# Patient Record
Sex: Male | Born: 1995 | Race: Black or African American | Hispanic: No | Marital: Single | Smoking: Never smoker
Health system: Southern US, Community
[De-identification: ages and names within clinical notes are randomized; demographics above are authoritative.]

## PROBLEM LIST (undated history)

## (undated) DIAGNOSIS — R55 Syncope and collapse: Secondary | ICD-10-CM

## (undated) DIAGNOSIS — I10 Essential (primary) hypertension: Secondary | ICD-10-CM

## (undated) HISTORY — DX: Essential (primary) hypertension: I10

## (undated) HISTORY — DX: Syncope and collapse: R55

## (undated) HISTORY — PX: NO PAST SURGERIES: SHX2092

---
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
## 2016-11-11 ENCOUNTER — Emergency Department (HOSPITAL_COMMUNITY)
Admission: EM | Admit: 2016-11-11 | Discharge: 2016-11-12 | Disposition: A | Discharge status: Home / Self Care | Payer: Self-pay | Attending: Emergency Medicine | Admitting: Emergency Medicine

## 2016-11-11 ENCOUNTER — Encounter (HOSPITAL_COMMUNITY): Payer: Self-pay | Admitting: Emergency Medicine

## 2016-11-11 ENCOUNTER — Emergency Department (HOSPITAL_COMMUNITY): Payer: Self-pay

## 2016-11-11 DIAGNOSIS — R55 Syncope and collapse: Secondary | ICD-10-CM | POA: Insufficient documentation

## 2016-11-11 LAB — BASIC METABOLIC PANEL
ANION GAP: 8 (ref 5–15)
BUN: 17 mg/dL (ref 6–20)
CO2: 25 mmol/L (ref 22–32)
Calcium: 9.6 mg/dL (ref 8.9–10.3)
Chloride: 103 mmol/L (ref 101–111)
Creatinine, Ser: 1.2 mg/dL (ref 0.61–1.24)
GFR calc Af Amer: >60 mL/min (ref >60)
GFR calc non Af Amer: >60 mL/min (ref >60)
GLUCOSE: 95 mg/dL (ref 65–99)
POTASSIUM: 3.2 mmol/L — AB (ref 3.5–5.1)
Sodium: 136 mmol/L (ref 135–145)

## 2016-11-11 LAB — CBC
HEMATOCRIT: 39.3 % (ref 39.0–52.0)
Hemoglobin: 12.9 g/dL — ABNORMAL LOW (ref 13.0–17.0)
MCH: 25.5 pg — ABNORMAL LOW (ref 26.0–34.0)
MCHC: 32.8 g/dL (ref 30.0–36.0)
MCV: 77.8 fL — AB (ref 78.0–100.0)
Platelets: 229 10*3/uL (ref 150–400)
RBC: 5.05 MIL/uL (ref 4.22–5.81)
RDW: 14.3 % (ref 11.5–15.5)
WBC: 3 10*3/uL — ABNORMAL LOW (ref 4.0–10.5)

## 2016-11-11 LAB — CBG MONITORING, ED: GLUCOSE-CAPILLARY: 88 mg/dL (ref 65–99)

## 2016-11-11 NOTE — ED Triage Notes (Addendum)
Pt states while shopping at check out counter he felt nauseated and dizzy, then passed out and hit back of his head on the floor. Does not take blood thinners. Denies medial history, he has had his heart checked before per mom. This happened when he was 21 years old? As well. Diabetes does run in family. Pt did not eat all day, only drank water. Also has pain to left elbow and left buttocks, full ROM to elbow, ambulatory at triage.

## 2016-11-12 MED ORDER — POTASSIUM CHLORIDE CRYS ER 20 MEQ PO TBCR
40.0000 meq | EXTENDED_RELEASE_TABLET | Freq: Once | ORAL | Status: AC
  Administered 2016-11-12: 40 meq via ORAL
  Filled 2016-11-12: qty 2

## 2016-11-12 NOTE — ED Provider Notes (Signed)
[facility]-EMERGENCY DEPT Provider Note   CSN: [id number] Arrival date & time: 11/11/16  1751     History   Chief Complaint Chief Complaint  Patient presents with  . Loss of Consciousness    HPI Hayden Webb is a 21 y.o. male.  Patient presents s/p syncopal event. Was standing in line, felt nauseated, warm, lightheaded, and then had brief fainting episode. No seizure activity or postictal/mental status changes were noted. Pt denies any palpitations. No chest pain or discomfort. No sob. No headache. Post fall denies pain or injury. No neck or back pain. No numbness/weakness. States feels fine now.  He indicates he had eaten almost nothing all day today. No other recent syncopal events.  No recent blood loss, rectal bleeding or melena. No med use/new meds. No recent febrile illness. No nvd.    The history is provided by the patient.  Loss of Consciousness   Pertinent negatives include abdominal pain, back pain, chest pain, confusion, fever, headaches, palpitations and weakness.    History reviewed. No pertinent past medical history.  There are no active problems to display for this patient.   No past surgical history on file.     Home Medications    Prior to Admission medications   Not on File    Family History No family history on file.  Social History Social History  Substance Use Topics  . Smoking status: Never Smoker  . Smokeless tobacco: Not on file  . Alcohol use No     Allergies   Patient has no known allergies.   Review of Systems Review of Systems  Constitutional: Negative for fever.  HENT: Negative for sore throat.   Eyes: Negative for redness.  Respiratory: Negative for shortness of breath.   Cardiovascular: Positive for syncope. Negative for chest pain and palpitations.  Gastrointestinal: Negative for abdominal pain.  Genitourinary: Negative for flank pain.  Musculoskeletal: Negative for back pain and neck pain.  Skin: Negative for wound.    Neurological: Negative for weakness, numbness and headaches.  Hematological: Does not bruise/bleed easily.  Psychiatric/Behavioral: Negative for confusion.     Physical Exam Updated Vital Signs BP 135/76   Pulse 60   Temp 98.3 F (36.8 C) (Oral)   Resp 12   Ht 1.778 m ( )   Wt 61.2 kg (135 lb)   SpO2 100%   BMI 19.37 kg/m   Physical Exam  Constitutional: He appears well-developed and well-nourished. No distress.  HENT:  Head: Atraumatic.  Mouth/Throat: Oropharynx is clear and moist.  Eyes: Pupils are equal, round, and reactive to light. Conjunctivae are normal.  Neck: Neck supple. No tracheal deviation present. No thyromegaly present.  No bruits.   Cardiovascular: Normal rate, regular rhythm, normal heart sounds and intact distal pulses.  Exam reveals no gallop and no friction rub.   No murmur heard. Pulmonary/Chest: Effort normal and breath sounds normal. No accessory muscle usage. No respiratory distress.  Abdominal: Soft. Bowel sounds are normal. He exhibits no distension. There is no tenderness.  Musculoskeletal: He exhibits no edema.  CTLS spine, non tender, aligned, no step off. No focal bony tenderness.   Neurological: He is alert.  Speech clear/fluent, steady gait.   Skin: Skin is warm and dry. No rash noted. He is not diaphoretic.  Psychiatric: He has a normal mood and affect.  Nursing note and vitals reviewed.    ED Treatments / Results  Labs (all labs ordered are listed, but only abnormal results are displayed) Results for  orders placed or performed during the hospital encounter of 11/11/16  Basic metabolic panel  Result Value Ref Range   Sodium 136 135 - 145 mmol/L   Potassium 3.2 (L) 3.5 - 5.1 mmol/L   Chloride 103 101 - 111 mmol/L   CO2 25 22 - 32 mmol/L   Glucose, Bld 95 65 - 99 mg/dL   BUN 17 6 - 20 mg/dL   Creatinine, Ser 4.09 0.61 - 1.24 mg/dL   Calcium 9.6 8.9 - 81.1 mg/dL   GFR calc non Af Amer >60 >60 mL/min   GFR calc Af Amer >60  >60 mL/min   Anion gap 8 5 - 15  CBC  Result Value Ref Range   WBC 3.0 (L) 4.0 - 10.5 K/uL   RBC 5.05 4.22 - 5.81 MIL/uL   Hemoglobin 12.9 (L) 13.0 - 17.0 g/dL   HCT 91.4 78.2 - 95.6 %   MCV 77.8 (L) 78.0 - 100.0 fL   MCH 25.5 (L) 26.0 - 34.0 pg   MCHC 32.8 30.0 - 36.0 g/dL   RDW 21.3 08.6 - 57.8 %   Platelets 229 150 - 400 K/uL  CBG monitoring, ED  Result Value Ref Range   Glucose-Capillary 88 65 - 99 mg/dL   Ct Head Wo Contrast  Result Date: 11/11/2016 CLINICAL DATA:  Per ed notes: Pt states while shopping at check out counter he felt nauseated and dizzy, then passed out and hit back of his head on the floor. Does not take blood thinners. Denies medial history, he has had his heart checked bef.*comment was truncated* EXAM: CT HEAD WITHOUT CONTRAST TECHNIQUE: Contiguous axial images were obtained from the base of the skull through the vertex without intravenous contrast. COMPARISON:  None. FINDINGS: Brain: No acute intracranial hemorrhage. No focal mass lesion. No CT evidence of acute infarction. No midline shift or mass effect. No hydrocephalus. Basilar cisterns are patent. Vascular: No hyperdense vessel or unexpected calcification. Skull: Normal. Negative for fracture or focal lesion. Sinuses/Orbits: Paranasal sinuses and mastoid air cells are clear. Orbits are clear. Other: None. IMPRESSION: 1. No acute intracranial findings.  No evidence trauma. 2. Normal head CT Electronically Signed   By: Genevive Bi M.D.   On: 11/11/2016 18:31    EKG  EKG Interpretation  Date/Time:  Saturday November 11 2016 18:01:37 EDT Ventricular Rate:  71 PR Interval:  150 QRS Duration: 84 QT Interval:  348 QTC Calculation: 378 R Axis:   84 Text Interpretation:  Sinus rhythm with marked sinus arrhythmia No previous tracing Confirmed by Cathren Laine ([id number]) on 11/11/2016 11:45:22 PM       Radiology Ct Head Wo Contrast  Result Date: 11/11/2016 CLINICAL DATA:  Per ed notes: Pt states while  shopping at check out counter he felt nauseated and dizzy, then passed out and hit back of his head on the floor. Does not take blood thinners. Denies medial history, he has had his heart checked bef.*comment was truncated* EXAM: CT HEAD WITHOUT CONTRAST TECHNIQUE: Contiguous axial images were obtained from the base of the skull through the vertex without intravenous contrast. COMPARISON:  None. FINDINGS: Brain: No acute intracranial hemorrhage. No focal mass lesion. No CT evidence of acute infarction. No midline shift or mass effect. No hydrocephalus. Basilar cisterns are patent. Vascular: No hyperdense vessel or unexpected calcification. Skull: Normal. Negative for fracture or focal lesion. Sinuses/Orbits: Paranasal sinuses and mastoid air cells are clear. Orbits are clear. Other: None. IMPRESSION: 1. No acute intracranial findings.  No evidence trauma.  2. Normal head CT Electronically Signed   By: Genevive Bi M.D.   On: 11/11/2016 18:31    Procedures Procedures (including critical care time)  Medications Ordered in ED Medications  potassium chloride SA (K-DUR,KLOR-CON) CR tablet 40 mEq (not administered)     Initial Impression / Assessment and Plan / ED Course  I have reviewed the triage vital signs and the nursing notes.  Pertinent labs & imaging results that were available during my care of the patient were reviewed by me and considered in my medical decision making (see chart for details).  Labs.  kcl po.  Po fluids.  nsr on monitor. No dysrhythmia.  Pt asymptomatic, and appears stable for d/c.     Final Clinical Impressions(s) / ED Diagnoses   Final diagnoses:  None    New Prescriptions New Prescriptions   No medications on file     Cathren Laine, MD 11/12/16 [phone]

## 2016-11-12 NOTE — Discharge Instructions (Signed)
It was our pleasure to provide your ER care today - we hope that you feel better.  Rest. Drink plenty of fluids.  From today's labs, your blood sugar/glucose is normal.   Your potassium level is mildly low - eat plenty of fruits and vegetables.  Follow up with primary care doctor in 1 week.  Return to ER if worse, new symptoms, chest pain, trouble breathing, weak/fainting, fast heart beat, other concern.

## 2016-11-12 NOTE — ED Notes (Signed)
Pt understood dc material. NAD Noted 

## 2016-11-30 ENCOUNTER — Ambulatory Visit (INDEPENDENT_AMBULATORY_CARE_PROVIDER_SITE_OTHER): Payer: Self-pay | Admitting: Physician Assistant

## 2016-11-30 ENCOUNTER — Encounter (INDEPENDENT_AMBULATORY_CARE_PROVIDER_SITE_OTHER): Payer: Self-pay | Admitting: Physician Assistant

## 2016-11-30 VITALS — BP 145/83 | HR 113 | Temp 98.6°F | Wt 135.0 lb

## 2016-11-30 DIAGNOSIS — Z23 Encounter for immunization: Secondary | ICD-10-CM

## 2016-11-30 DIAGNOSIS — R000 Tachycardia, unspecified: Secondary | ICD-10-CM

## 2016-11-30 DIAGNOSIS — R030 Elevated blood-pressure reading, without diagnosis of hypertension: Secondary | ICD-10-CM

## 2016-11-30 DIAGNOSIS — R55 Syncope and collapse: Secondary | ICD-10-CM

## 2016-11-30 DIAGNOSIS — D649 Anemia, unspecified: Secondary | ICD-10-CM

## 2016-11-30 DIAGNOSIS — E876 Hypokalemia: Secondary | ICD-10-CM

## 2016-11-30 DIAGNOSIS — R002 Palpitations: Secondary | ICD-10-CM

## 2016-11-30 DIAGNOSIS — Z09 Encounter for follow-up examination after completed treatment for conditions other than malignant neoplasm: Secondary | ICD-10-CM

## 2016-11-30 MED ORDER — CARVEDILOL 12.5 MG PO TABS: 12.5000 mg | ORAL_TABLET | Freq: Two times a day (BID) | ORAL | 1 refills | Status: DC

## 2016-11-30 NOTE — Progress Notes (Signed)
Subjective:  Patient ID: Hayden Webb, male    DOB: 08-30-1995  Age: 21 y.o. MRN: [id number]  CC: hospital f/u syncope  HPI Hayden Webb is a 20 y.o. male with no significant medical history presents on f/u of syncope. Went to ED on 11/11/16 for syncope. No final diagnosis given. No medications prescribed. Had two previous episodes of syncope at 21 yo and 21 yo. Patient reports having episodes of "racing heartbeat" approximately once a month. Thinks he also has palpitations. Says he feels well now and does not have any symptoms or complaints.      ROS Review of Systems  Constitutional: Negative for chills, fever and malaise/fatigue.  HENT: Negative for congestion, nosebleeds, sinus pain and sore throat.   Eyes: Negative for blurred vision.  Respiratory: Negative for cough and shortness of breath.   Cardiovascular: Positive for palpitations. Negative for chest pain, orthopnea, claudication, leg swelling and PND.  Gastrointestinal: Negative for abdominal pain and nausea.  Genitourinary: Negative for dysuria and hematuria.  Musculoskeletal: Negative for joint pain and myalgias.  Skin: Negative for rash.  Neurological: Negative for tingling and headaches.  Psychiatric/Behavioral: Negative for depression. The patient is not nervous/anxious.     Objective:  BP (!) 143/92 (BP Location: Right Arm, Patient Position: Sitting, Cuff Size: Normal)   Pulse (!) 119   Temp 98.6 F (37 C) (Oral)   Wt 135 lb (61.2 kg)   SpO2 100%   BMI 19.37 kg/m   BP/Weight 11/30/2016 11/12/2016 11/11/2016  Systolic BP 143 129 -  Diastolic BP 92 76 -  Wt. (Lbs) 135 - 135  BMI 19.37 - 19.37      Physical Exam  Constitutional: He is oriented to person, place, and time.  NAD, weill nourished, polite  HENT:  Head: Normocephalic and atraumatic.  Erythematous patches of the pharynx  Eyes: Conjunctivae are normal. No scleral icterus.  Neck: Normal range of motion. No thyromegaly (difficult to assess due  to body habitus) present.  Cardiovascular: Regular rhythm, normal heart sounds and intact distal pulses.   Tachycardic  Pulmonary/Chest: Effort normal and breath sounds normal. No respiratory distress. He has no wheezes. He has no rales.  Abdominal:  Difficult to assess for abdominal mass due to body habitus   Musculoskeletal: He exhibits no edema.  Neurological: He is alert and oriented to person, place, and time. No cranial nerve deficit. Coordination normal.  Skin: Skin is warm and dry. No rash noted.  Psychiatric: He has a normal mood and affect.  Vitals reviewed.    Assessment & Plan:    1. Hospital discharge follow-up - Records reviewed.  2. Syncope, unspecified syncope type - Ambulatory referral to Cardiology - TSH - Magnesium - Comprehensive metabolic panel - Antistreptolysin O titer  3. Tachycardia - EKG 12-Lead reveals sinus tachycardia. In particular, no WPW or Brugada. - Ambulatory referral to Cardiology - TSH - Magnesium - Comprehensive metabolic panel - Begin carvedilol (COREG) 12.5 MG tablet; Take 1 tablet (12.5 mg total) by mouth 2 (two) times daily with a meal.  Dispense: 90 tablet; Refill: 1  4. Palpitations - EKG 12-Lead reveals sinus tachycardia. In particular, no WPW or Brugada. - Ambulatory referral to Cardiology - TSH - Magnesium - Comprehensive metabolic panel - Antistreptolysin O titer - Begin carvedilol (COREG) 12.5 MG tablet; Take 1 tablet (12.5 mg total) by mouth 2 (two) times daily with a meal.  Dispense: 90 tablet; Refill: 1  5. Hypokalemia - Comprehensive metabolic panel  6. Anemia,  unspecified type - CBC with Differential  7. Elevated blood-pressure reading without diagnosis of hypertension  8. Need for prophylactic vaccination and inoculation against influenza - Flu Vaccine QUAD 6+ mos PF IM (Fluarix Quad PF)   Meds ordered this encounter  Medications  . carvedilol (COREG) 12.5 MG tablet    Sig: Take 1 tablet (12.5 mg total)  by mouth 2 (two) times daily with a meal.    Dispense:  90 tablet    Refill:  1    Order Specific Question:   Supervising Provider    Answer:   Quentin Angst [[id number]]    Follow-up: Return in about 8 weeks (around 01/25/2017) for f/u cardiology.   Loletta Specter PA

## 2016-11-30 NOTE — Patient Instructions (Signed)
Palpitations A palpitation is the feeling that your heartbeat is irregular or is faster than normal. It may feel like your heart is fluttering or skipping a beat. Palpitations are usually not a serious problem. They may be caused by many things, including smoking, caffeine, alcohol, stress, and certain medicines. Although most causes of palpitations are not serious, palpitations can be a sign of a serious medical problem. In some cases, you may need further medical evaluation. Follow these instructions at home: Pay attention to any changes in your symptoms. Take these actions to help with your condition:  Avoid the following: ? Caffeinated coffee, tea, soft drinks, diet pills, and energy drinks. ? Chocolate. ? Alcohol.  Do not use any tobacco products, such as cigarettes, chewing tobacco, and e-cigarettes. If you need help quitting, ask your health care provider.  Try to reduce your stress and anxiety. Things that can help you relax include: ? Yoga. ? Meditation. ? Physical activity, such as swimming, jogging, or walking. ? Biofeedback. This is a method that helps you learn to use your mind to control things in your body, such as your heartbeats.  Get plenty of rest and sleep.  Take over-the-counter and prescription medicines only as told by your health care provider.  Keep all follow-up visits as told by your health care provider. This is important.  Contact a health care provider if:  You continue to have a fast or irregular heartbeat after 24 hours.  Your palpitations occur more often. Get help right away if:  You have chest pain or shortness of breath.  You have a severe headache.  You feel dizzy or you faint. This information is not intended to replace advice given to you by your health care provider. Make sure you discuss any questions you have with your health care provider. Document Released: 02/04/2000 Document Revised: 07/12/2015 Document Reviewed: 10/22/2014 Elsevier  Interactive Patient Education  2017 Elsevier Inc.  

## 2016-12-01 ENCOUNTER — Telehealth (INDEPENDENT_AMBULATORY_CARE_PROVIDER_SITE_OTHER): Payer: Self-pay

## 2016-12-01 LAB — COMPREHENSIVE METABOLIC PANEL
ALT: 24 IU/L (ref 0–44)
AST: 17 IU/L (ref 0–40)
Albumin/Globulin Ratio: 1.6 (ref 1.2–2.2)
Albumin: 5.1 g/dL (ref 3.5–5.5)
Alkaline Phosphatase: 62 IU/L (ref 39–117)
BUN/Creatinine Ratio: 15 (ref 9–20)
BUN: 17 mg/dL (ref 6–20)
Bilirubin Total: 0.7 mg/dL (ref 0.0–1.2)
CO2: 26 mmol/L (ref 20–29)
Calcium: 10.2 mg/dL (ref 8.7–10.2)
Chloride: 101 mmol/L (ref 96–106)
Creatinine, Ser: 1.1 mg/dL (ref 0.76–1.27)
GFR calc Af Amer: 111 mL/min/{1.73_m2} (ref >59)
GFR calc non Af Amer: 96 mL/min/{1.73_m2} (ref >59)
Globulin, Total: 3.2 g/dL (ref 1.5–4.5)
Glucose: 91 mg/dL (ref 65–99)
Potassium: 4.2 mmol/L (ref 3.5–5.2)
Sodium: 142 mmol/L (ref 134–144)
Total Protein: 8.3 g/dL (ref 6.0–8.5)

## 2016-12-01 LAB — CBC WITH DIFFERENTIAL/PLATELET
BASOS: 0 %
Basophils Absolute: 0 10*3/uL (ref 0.0–0.2)
EOS (ABSOLUTE): 0.1 10*3/uL (ref 0.0–0.4)
EOS: 3 %
HEMATOCRIT: 41.7 % (ref 37.5–51.0)
HEMOGLOBIN: 13.9 g/dL (ref 13.0–17.7)
IMMATURE GRANS (ABS): 0 10*3/uL (ref 0.0–0.1)
IMMATURE GRANULOCYTES: 0 %
Lymphocytes Absolute: 0.9 10*3/uL (ref 0.7–3.1)
Lymphs: 34 %
MCH: 26 pg — ABNORMAL LOW (ref 26.6–33.0)
MCHC: 33.3 g/dL (ref 31.5–35.7)
MCV: 78 fL — ABNORMAL LOW (ref 79–97)
MONOCYTES: 5 %
MONOS ABS: 0.1 10*3/uL (ref 0.1–0.9)
NEUTROS PCT: 58 %
Neutrophils Absolute: 1.6 10*3/uL (ref 1.4–7.0)
Platelets: 240 10*3/uL (ref 150–379)
RBC: 5.34 x10E6/uL (ref 4.14–5.80)
RDW: 15.6 % — AB (ref 12.3–15.4)
WBC: 2.8 10*3/uL — AB (ref 3.4–10.8)

## 2016-12-01 LAB — TSH: TSH: 2.93 u[IU]/mL (ref 0.450–4.500)

## 2016-12-01 LAB — MAGNESIUM: Magnesium: 2.1 mg/dL (ref 1.6–2.3)

## 2016-12-01 LAB — ANTISTREPTOLYSIN O TITER: ASO: <20 IU/mL (ref 0.0–200.0)

## 2016-12-01 NOTE — Telephone Encounter (Signed)
-----   Message from Roger David Gomez, PA-C sent at 12/01/2016  8:35 AM EDT ----- All results normal/unremarkable 

## 2016-12-01 NOTE — Telephone Encounter (Signed)
-----   Message from Loletta Specter, PA-C sent at 12/01/2016  8:35 AM EDT ----- All results normal/unremarkable

## 2016-12-01 NOTE — Telephone Encounter (Signed)
Left message asking patient to call office. Tempestt S Roberts, CMA  

## 2016-12-01 NOTE — Telephone Encounter (Signed)
Patient aware of normal lab results. Torunn Chancellor S Hall Birchard, CMA  

## 2017-01-25 ENCOUNTER — Encounter (INDEPENDENT_AMBULATORY_CARE_PROVIDER_SITE_OTHER): Payer: Self-pay | Admitting: Physician Assistant

## 2017-01-25 ENCOUNTER — Ambulatory Visit (INDEPENDENT_AMBULATORY_CARE_PROVIDER_SITE_OTHER): Payer: Self-pay | Admitting: Physician Assistant

## 2017-01-25 VITALS — BP 137/71 | HR 85 | Temp 98.0°F | Resp 18 | Ht 70.0 in | Wt 123.0 lb

## 2017-01-25 DIAGNOSIS — R000 Tachycardia, unspecified: Secondary | ICD-10-CM

## 2017-01-25 DIAGNOSIS — R55 Syncope and collapse: Secondary | ICD-10-CM

## 2017-01-25 NOTE — Patient Instructions (Signed)
Syncope Syncope is when you lose temporarily pass out (faint). Signs that you may be about to pass out include:  Feeling dizzy or light-headed.  Feeling sick to your stomach (nauseous).  Seeing all white or all black.  Having cold, clammy skin.  If you passed out, get help right away. Call your local emergency services (911 in the [place] Do not drive yourself to the hospital. Follow these instructions at home: Pay attention to any changes in your symptoms. Take these actions to help with your condition:  Have someone stay with you until you feel stable.  Do not drive, use machinery, or play sports until your doctor says it is okay.  Keep all follow-up visits as told by your doctor. This is important.  If you start to feel like you might pass out, lie down right away and raise (elevate) your feet above the level of your heart. Breathe deeply and steadily. Wait until all of the symptoms are gone.  Drink enough fluid to keep your pee (urine) clear or pale yellow.  If you are taking blood pressure or heart medicine, get up slowly and spend many minutes getting ready to sit and then stand. This can help with dizziness.  Take over-the-counter and prescription medicines only as told by your doctor.  Get help right away if:  You have a very bad headache.  You have unusual pain in your chest, tummy, or back.  You are bleeding from your mouth or rectum.  You have black or tarry poop (stool).  You have a very fast or uneven heartbeat (palpitations).  It hurts to breathe.  You pass out once or more than once.  You have jerky movements that you cannot control (seizure).  You are confused.  You have trouble walking.  You are very weak.  You have vision problems. These symptoms may be an emergency. Do not wait to see if the symptoms will go away. Get medical help right away. Call your local emergency services (911 in the [place] Do not drive yourself to the hospital. This  information is not intended to replace advice given to you by your health care provider. Make sure you discuss any questions you have with your health care provider. Document Released: 07/26/2007 Document Revised: 07/15/2015 Document Reviewed: 10/21/2014 Elsevier Interactive Patient Education  2018 Elsevier Inc.  

## 2017-01-25 NOTE — Progress Notes (Signed)
   Subjective:  Patient ID: Hayden Webb, male    DOB: 12/25/1995  Age: 21 y.o. MRN: [id number]  CC: hospital f/u   HPI  Hayden Webb is a 21 y.o. male with no significant medical history presents on f/u of syncope. Went to ED on 11/11/16 for syncope. Seen here on 11/30/16, and TSH, Mg, CMP, ASO were normal. EKG revealed sinus tachycardia. No WPW or Brugada. Was referred to cardiology but has not seen seen yet. [facility]. He is taking carvedilol as directed.  Does not endorse presyncope, syncope, CP, palpitations, SOB, HA, abdominal pain, dizziness, lightheadedness, edema, rash, f/c/n/v, or GI/GU sxs.    Outpatient Medications Prior to Visit  Medication Sig Dispense Refill  . carvedilol (COREG) 12.5 MG tablet Take 1 tablet (12.5 mg total) by mouth 2 (two) times daily with a meal. 90 tablet 1   No facility-administered medications prior to visit.      ROS Review of Systems  Constitutional: Negative for chills, fever and malaise/fatigue.  Eyes: Negative for blurred vision.  Respiratory: Negative for shortness of breath.   Cardiovascular: Negative for chest pain and palpitations.  Gastrointestinal: Negative for abdominal pain and nausea.  Genitourinary: Negative for dysuria and hematuria.  Musculoskeletal: Negative for joint pain and myalgias.  Skin: Negative for rash.  Neurological: Negative for tingling and headaches.  Psychiatric/Behavioral: Negative for depression. The patient is not nervous/anxious.     Objective:   Vitals:   01/25/17 0836  BP: 137/71  Pulse: 85  Resp: 18  Temp: 98 F (36.7 C)  SpO2: 99%     Physical Exam  Constitutional: He is oriented to person, place, and time.  Well developed, well nourished, NAD, polite  HENT:  Head: Normocephalic and atraumatic.  Eyes: No scleral icterus.  Neck: Normal range of motion. Neck supple. No thyromegaly present.  Cardiovascular: Normal rate, regular rhythm and normal heart  sounds. Exam reveals no gallop and no friction rub.  No murmur heard. Pulmonary/Chest: Effort normal and breath sounds normal. No respiratory distress. He has no wheezes. He has no rales.  Abdominal: Soft. Bowel sounds are normal. There is no tenderness.  Musculoskeletal: He exhibits no edema.  Neurological: He is alert and oriented to person, place, and time.  Skin: Skin is warm and dry. No rash noted. No erythema. No pallor.  Psychiatric: He has a normal mood and affect. His behavior is normal. Thought content normal.  Vitals reviewed.    Assessment & Plan:    1. Syncope, unspecified syncope type - No recurrent episodes. - Advised patient to call [facility]ology to obtain his appointment time.  2. Tachycardia - No tachycardia since taking carvedilol. - Advised patient to call [facility] cardiology to obtain his appointment time.    Follow-up: Return in about 8 weeks (around 03/22/2017) for Tachycardia and syncope.   Loletta Specteroger David Gomez PA

## 2017-02-23 ENCOUNTER — Ambulatory Visit: Payer: Medicaid Other | Admitting: Cardiovascular Disease

## 2017-03-02 ENCOUNTER — Encounter: Payer: Self-pay | Admitting: Internal Medicine

## 2017-03-02 ENCOUNTER — Ambulatory Visit: Payer: BLUE CROSS/BLUE SHIELD | Admitting: Internal Medicine

## 2017-03-02 ENCOUNTER — Ambulatory Visit: Payer: Medicaid Other | Admitting: Internal Medicine

## 2017-03-02 VITALS — BP 139/80 | HR 77 | Resp 16 | Ht 70.0 in | Wt 140.2 lb

## 2017-03-02 DIAGNOSIS — R55 Syncope and collapse: Secondary | ICD-10-CM

## 2017-03-02 DIAGNOSIS — R000 Tachycardia, unspecified: Secondary | ICD-10-CM | POA: Diagnosis not present

## 2017-03-02 NOTE — Patient Instructions (Addendum)
Your physician recommends that you continue on your current medications as directed. Please refer to the Current Medication list given to you today.  Dr. Rennis GoldenHilty recommends checking your blood pressure at home.  Check your BP about 1-2 hours after each dose of carvedilol.   Your physician recommends that you schedule a follow-up appointment in: 3-4 months with Dr. Rennis GoldenHilty.

## 2017-03-03 ENCOUNTER — Encounter: Payer: Self-pay | Admitting: Internal Medicine

## 2017-03-03 DIAGNOSIS — R000 Tachycardia, unspecified: Secondary | ICD-10-CM | POA: Insufficient documentation

## 2017-03-03 DIAGNOSIS — R55 Syncope and collapse: Secondary | ICD-10-CM | POA: Insufficient documentation

## 2017-03-03 DIAGNOSIS — I4711 Inappropriate sinus tachycardia, so stated: Secondary | ICD-10-CM | POA: Insufficient documentation

## 2017-03-03 NOTE — Progress Notes (Addendum)
OFFICE CONSULT NOTE  Chief Complaint:  Syncope  Primary Care Physician: Loletta Specter, PA-C  HPI:   Hayden Webb is a 22 y.o. male who is being seen today for the evaluation of syncope at the request of Loletta Specter, PA-C. Mr. Beeks has no chronic medical problems. He is accompanied by his mother who reports he has had 3-4 syncopal episodes over the past several years. None were associated with exertion. All of the episodes had a clear prodrome of dizziness, nausea and blurred vision followed by syncope. He has had tachycardia in the past, but could not clearly state that tachycardia preceded these events. He was seen by his PCP recently and noted to be hypertensive and tachycardic in the office. He was started on a relatively higher dose of coreg at 12.5 mg BID. He seems to tolerate it and his HR has improved. He has not had further syncopal events since starting the medication.  PMHx:  Past Medical History:  Diagnosis Date  . Syncope and collapse     History reviewed. No pertinent surgical history.  FAMHx:  Family History  Problem Relation Age of Onset  . Heart attack Maternal Grandmother   . Heart attack Maternal Grandfather   . Heart attack Paternal Grandmother   . Heart attack Paternal Grandfather     SOCHx:   reports that  has never smoked. he has never used smokeless tobacco. He reports that he does not drink alcohol. His drug history is not on file.  ALLERGIES:  No Known Allergies  ROS: Pertinent items noted in HPI and remainder of comprehensive ROS otherwise negative.  HOME MEDS: Current Outpatient Medications on File Prior to Visit  Medication Sig Dispense Refill  . carvedilol (COREG) 12.5 MG tablet Take 1 tablet (12.5 mg total) by mouth 2 (two) times daily with a meal. 90 tablet 1   No current facility-administered medications on file prior to visit.     LABS/IMAGING: No results found for this or any previous visit (from the past 48  hour(s)). No results found.  LIPID PANEL: No results found for: CHOL, TRIG, HDL, CHOLHDL, VLDL, LDLCALC, LDLDIRECT   WEIGHTS: Wt Readings from Last 3 Encounters:  03/02/17 140 lb 3.2 oz (63.6 kg)  01/25/17 123 lb (55.8 kg)  11/30/16 135 lb (61.2 kg)    VITALS: BP 139/80   Pulse 77   Resp 16   Ht 5\' 10"  (1.778 m)   Wt 140 lb 3.2 oz (63.6 kg)   SpO2 100%   BMI 20.12 kg/m   EXAM: General appearance: alert and no distress Neck: no carotid bruit, no JVD and thyroid not enlarged, symmetric, no tenderness/mass/nodules Lungs: clear to auscultation bilaterally Heart: regular rate and rhythm, S1, S2 normal, no murmur, click, rub or gallop Abdomen: soft, non-tender; bowel sounds normal; no masses,  no organomegaly Extremities: extremities normal, atraumatic, no cyanosis or edema Pulses: 2+ and symmetric Skin: Skin color, texture, turgor normal. No rashes or lesions Neurologic: Grossly normal Psych: Pleasant  EKG: NSR at 68 - personally reviewed  ASSESSMENT: 1. Vasovagal syncope 2. ?inappropriate tachycardia  PLAN: 1.   Mr. Finigan has fairly classic vasovagal syncope with a clear prodrome prior to the events. He also has had tachycardia, which is improved on b-blocker. 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 We have to be careful with BB, especially at this higher dose in a young, thin, active male. He seems  to tolerate it, although beta blockers may worsen vasovagal syncope. With his inappropriate tachycardia, I wonder if he could have a POTS spectrum disorder. We discussed preventative measures to help avoid syncope in the future, hydration and liberalization of salt in the diet can be helpful as well as LE thigh high compression stockings.  No further testing is warranted at this time. Thanks for the consultation. Follow-up in 3-4 months.  Chrystie NoseKenneth C. Justino Boze, MD, [facility]ACC, FACP  [place]  [facility]  Medical  Director of the Advanced Lipid Disorders &  Cardiovascular Risk Reduction Clinic Diplomate of the American Board of Clinical Lipidology Attending Cardiologist  Direct Dial: [phone]  Fax: [phone]  Website:  [email or web address]  Roseann Kees C Shaylinn Hladik 03/03/2017, 1:36 PM

## 2017-03-03 NOTE — Addendum Note (Signed)
Addended by: Chrystie NoseHILTY, Zannie Runkle C on: 03/03/2017 01:45 PM   Modules accepted: Level of Service

## 2017-03-20 ENCOUNTER — Encounter (INDEPENDENT_AMBULATORY_CARE_PROVIDER_SITE_OTHER): Payer: Self-pay | Admitting: Physician Assistant

## 2017-03-20 ENCOUNTER — Ambulatory Visit (INDEPENDENT_AMBULATORY_CARE_PROVIDER_SITE_OTHER): Payer: BLUE CROSS/BLUE SHIELD | Admitting: Physician Assistant

## 2017-03-20 ENCOUNTER — Telehealth: Payer: Self-pay | Admitting: Internal Medicine

## 2017-03-20 VITALS — BP 141/77 | HR 79 | Temp 98.1°F | Resp 18 | Ht 70.0 in | Wt 141.0 lb

## 2017-03-20 DIAGNOSIS — R55 Syncope and collapse: Secondary | ICD-10-CM

## 2017-03-20 MED ORDER — CARVEDILOL 6.25 MG PO TABS: 6.2500 mg | ORAL_TABLET | Freq: Two times a day (BID) | ORAL | 2 refills | Status: DC

## 2017-03-20 NOTE — Telephone Encounter (Signed)
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 He got through the presentation, became dizzy and then passed out. She reports he woke up quickly. Prior to school today he was just feeling anxious. He was seen by his PCP, they did blood work and told him to increase his salt intake. They also decreased his carvedilol to 6.25 mg twice daily. She was told to call and let us know. He is fine now, at home resting. He has a follow up Friday, will make dr hilty aware.

## 2017-03-20 NOTE — Telephone Encounter (Signed)
Patient mother calling, states that her son had a syncope episode today at school and was told to schedule with Dr. Rennis GoldenHilty per PCP.   Patient scheduled 03-23-17

## 2017-03-20 NOTE — Progress Notes (Signed)
Subjective:  Patient ID: Hayden HartshornJoshua L Linder, male    DOB: 08/11/1995  Age: 22 y.o. MRN: [id number]  CC:   HPI Hayden Webb is a 22 y.o. male with a medical history of syncope and collapse presents on f/u of syncope.  Last seen here 3.5 months ago as a hospital f/u for syncope. Was found to be tachycardic and hypertensive. Prescribed carvedilol 12.5 mg BID which has controlled his tachycardia. Saw cardiology and it was thought he may have vasovagal syncope and perhaps inappropriate tachycardia. Cardiology did not think there was a dysrhythmia.   Carvedilol was thought to be relatively high but kept on board as patient responded well to the medication in regards to tachycardia. However, caution was still advised. Liberal use of salt and compression stockings were advised but patient did not know about this advice.    Patient had a total of 4 syncopal episodes since he was an adolescent. Last episode was this morning. 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 Paramedics were called and they ran a rhythm strip which was NSR. Pt says he felt dizzy moments before his collapse. Does not endorse any other symptoms to include chest pain, palpitations, SOB, HA, tingling, numbness, weakness, visual disturbances, f/c/n/v, abdominal pain, rash, swelling, or GI/GU sxs.       Outpatient Medications Prior to Visit  Medication Sig Dispense Refill  . carvedilol (COREG) 12.5 MG tablet Take 1 tablet (12.5 mg total) by mouth 2 (two) times daily with a meal. 90 tablet 1   No facility-administered medications prior to visit.      ROS Review of Systems  Constitutional: Negative for chills, fever and malaise/fatigue.  Eyes: Negative for blurred vision.  Respiratory: Negative for shortness of breath.   Cardiovascular: Negative for chest pain and palpitations.       Syncope  Gastrointestinal: Negative for abdominal pain and nausea.  Genitourinary: Negative for dysuria and hematuria.   Musculoskeletal: Negative for joint pain and myalgias.  Skin: Negative for rash.  Neurological: Negative for tingling and headaches.  Psychiatric/Behavioral: Negative for depression. The patient is not nervous/anxious.     Objective:  There were no vitals taken for this visit.  BP/Weight 03/02/2017 01/25/2017 11/30/2016  Systolic BP 139 137 145  Diastolic BP 80 71 83  Wt. (Lbs) 140.2 123 135  BMI 20.12 17.65 19.37      Physical Exam  Constitutional: He is oriented to person, place, and time.  Well developed, well nourished, NAD, polite  HENT:  Head: Normocephalic and atraumatic.  Eyes: Conjunctivae and EOM are normal. Pupils are equal, round, and reactive to light. No scleral icterus.  Neck: Normal range of motion. Neck supple. No thyromegaly present.  Cardiovascular: Normal rate, regular rhythm and normal heart sounds.  No lower extremity edema. No carotid bruits.   Pulmonary/Chest: Effort normal and breath sounds normal.  Abdominal: Soft. Bowel sounds are normal. There is no tenderness.  Musculoskeletal: He exhibits no edema.  Lymphadenopathy:    He has no cervical adenopathy.  Neurological: He is alert and oriented to person, place, and time. No cranial nerve deficit. Coordination normal.  Normal gait. Strength 5/5 throughout.   Skin: Skin is warm and dry. No rash noted. No erythema. No pallor.  Psychiatric: He has a normal mood and affect. His behavior is normal. Thought content normal.  Vitals reviewed.    Assessment & Plan:    1. Syncope and collapse - Orthostatics normal in clinic today with compensatory increase  in heart rate and <15 mmHg change in BP. - Urgent Cardiac referral was made for cardiac event monitor placement and for tilt table testing. I have advised that pt or father call the cardiologist and notify him of pt's syncope so that another consultation can be scheduled. I advised that he keep his cardiologist's recommendations.  - Decrease carvedilol  (COREG) 6.25 MG tablet; Take 1 tablet (6.25 mg total) by mouth 2 (two) times daily with a meal.  Dispense: 30 tablet; Refill: 2 - CBC with Differential - Comprehensive metabolic panel - Troponin I   Meds ordered this encounter  Medications  . carvedilol (COREG) 6.25 MG tablet    Sig: Take 1 tablet (6.25 mg total) by mouth 2 (two) times daily with a meal.    Dispense:  30 tablet    Refill:  2    Order Specific Question:   Supervising Provider    Answer:   Quentin Angst [[id number]]    Follow-up: Return in about 4 weeks (around 04/17/2017) for syncope.   Loletta Specter PA

## 2017-03-20 NOTE — Telephone Encounter (Signed)
Thanks

## 2017-03-20 NOTE — Patient Instructions (Addendum)
  You may call [facility] at [phone] and tell them you have fainted again.   Syncope Syncope is when you lose temporarily pass out (faint). Signs that you may be about to pass out include:  Feeling dizzy or light-headed.  Feeling sick to your stomach (nauseous).  Seeing all white or all black.  Having cold, clammy skin.  If you passed out, get help right away. Call your local emergency services (911 in the [place] Do not drive yourself to the hospital. Follow these instructions at home: Pay attention to any changes in your symptoms. Take these actions to help with your condition:  Have someone stay with you until you feel stable.  Do not drive, use machinery, or play sports until your doctor says it is okay.  Keep all follow-up visits as told by your doctor. This is important.  If you start to feel like you might pass out, lie down right away and raise (elevate) your feet above the level of your heart. Breathe deeply and steadily. Wait until all of the symptoms are gone.  Drink enough fluid to keep your pee (urine) clear or pale yellow.  If you are taking blood pressure or heart medicine, get up slowly and spend many minutes getting ready to sit and then stand. This can help with dizziness.  Take over-the-counter and prescription medicines only as told by your doctor.  Get help right away if:  You have a very bad headache.  You have unusual pain in your chest, tummy, or back.  You are bleeding from your mouth or rectum.  You have black or tarry poop (stool).  You have a very fast or uneven heartbeat (palpitations).  It hurts to breathe.  You pass out once or more than once.  You have jerky movements that you cannot control (seizure).  You are confused.  You have trouble walking.  You are very weak.  You have vision problems. These symptoms may be an emergency. Do not wait to see if the symptoms will go away. Get medical help right away. Call your local  emergency services (911 in the [place] Do not drive yourself to the hospital. This information is not intended to replace advice given to you by your health care provider. Make sure you discuss any questions you have with your health care provider. Document Released: 07/26/2007 Document Revised: 07/15/2015 Document Reviewed: 10/21/2014 Elsevier Interactive Patient Education  [employer]2018 Elsevier Inc.

## 2017-03-21 ENCOUNTER — Telehealth (INDEPENDENT_AMBULATORY_CARE_PROVIDER_SITE_OTHER): Payer: Self-pay | Admitting: *Deleted

## 2017-03-21 ENCOUNTER — Encounter: Payer: Self-pay | Admitting: Internal Medicine

## 2017-03-21 LAB — CBC WITH DIFFERENTIAL/PLATELET
BASOS: 0 %
Basophils Absolute: 0 10*3/uL (ref 0.0–0.2)
EOS (ABSOLUTE): 0 10*3/uL (ref 0.0–0.4)
Eos: 1 %
HEMOGLOBIN: 12.9 g/dL — AB (ref 13.0–17.7)
Hematocrit: 38.4 % (ref 37.5–51.0)
IMMATURE GRANS (ABS): 0 10*3/uL (ref 0.0–0.1)
Immature Granulocytes: 0 %
LYMPHS: 23 %
Lymphocytes Absolute: 1.4 10*3/uL (ref 0.7–3.1)
MCH: 26.1 pg — AB (ref 26.6–33.0)
MCHC: 33.6 g/dL (ref 31.5–35.7)
MCV: 78 fL — ABNORMAL LOW (ref 79–97)
Monocytes Absolute: 0.2 10*3/uL (ref 0.1–0.9)
Monocytes: 3 %
NEUTROS ABS: 4.3 10*3/uL (ref 1.4–7.0)
Neutrophils: 73 %
Platelets: 243 10*3/uL (ref 150–379)
RBC: 4.95 x10E6/uL (ref 4.14–5.80)
RDW: 15.4 % (ref 12.3–15.4)
WBC: 5.9 10*3/uL (ref 3.4–10.8)

## 2017-03-21 LAB — COMPREHENSIVE METABOLIC PANEL
ALBUMIN: 5.1 g/dL (ref 3.5–5.5)
ALT: 16 IU/L (ref 0–44)
AST: 13 IU/L (ref 0–40)
Albumin/Globulin Ratio: 1.8 (ref 1.2–2.2)
Alkaline Phosphatase: 62 IU/L (ref 39–117)
BILIRUBIN TOTAL: 0.2 mg/dL (ref 0.0–1.2)
BUN / CREAT RATIO: 14 (ref 9–20)
BUN: 15 mg/dL (ref 6–20)
CHLORIDE: 102 mmol/L (ref 96–106)
CO2: 24 mmol/L (ref 20–29)
Calcium: 10.3 mg/dL — ABNORMAL HIGH (ref 8.7–10.2)
Creatinine, Ser: 1.07 mg/dL (ref 0.76–1.27)
GFR, EST AFRICAN AMERICAN: 115 mL/min/{1.73_m2} (ref >59)
GFR, EST NON AFRICAN AMERICAN: 99 mL/min/{1.73_m2} (ref >59)
GLUCOSE: 95 mg/dL (ref 65–99)
Globulin, Total: 2.9 g/dL (ref 1.5–4.5)
Potassium: 3.9 mmol/L (ref 3.5–5.2)
Sodium: 141 mmol/L (ref 134–144)
TOTAL PROTEIN: 8 g/dL (ref 6.0–8.5)

## 2017-03-21 LAB — TROPONIN I: Troponin I: <0.01 ng/mL (ref 0.00–0.04)

## 2017-03-21 NOTE — Telephone Encounter (Signed)
Medical Assistant left message on patient's home and cell voicemail. Voicemail states to [place] [facility] [place] [facility] [place] [facility] [phone]. Patient is aware of cardiac marker being negative and all other labs being unremarkable. Patient is aware of slight anemia and needing to take OTC gentle iron pills.

## 2017-03-21 NOTE — Telephone Encounter (Signed)
-----   Message from Loletta Specteroger David Gomez, PA-C sent at 03/21/2017  8:45 AM EST ----- Cardiac marker negative, very slight anemia, and rest of labs unremarkable. Take OTC Gentle Iron pills.

## 2017-03-22 ENCOUNTER — Ambulatory Visit (INDEPENDENT_AMBULATORY_CARE_PROVIDER_SITE_OTHER): Payer: Medicaid Other | Admitting: Physician Assistant

## 2017-03-23 ENCOUNTER — Encounter: Payer: Self-pay | Admitting: Internal Medicine

## 2017-03-23 ENCOUNTER — Ambulatory Visit (INDEPENDENT_AMBULATORY_CARE_PROVIDER_SITE_OTHER): Payer: BLUE CROSS/BLUE SHIELD | Admitting: Internal Medicine

## 2017-03-23 VITALS — BP 112/68 | HR 91 | Ht 70.0 in | Wt 140.0 lb

## 2017-03-23 DIAGNOSIS — R55 Syncope and collapse: Secondary | ICD-10-CM | POA: Diagnosis not present

## 2017-03-23 DIAGNOSIS — R000 Tachycardia, unspecified: Secondary | ICD-10-CM

## 2017-03-23 MED ORDER — METOPROLOL SUCCINATE ER 25 MG PO TB24: 12.5000 mg | ORAL_TABLET | Freq: Every day | ORAL | 11 refills | Status: DC

## 2017-03-23 NOTE — Patient Instructions (Addendum)
Your physician has recommended you make the following change in your medication:  -- STOP carvedilol  -- START metoprolol succinate (Toprol XL) 12.5mg  daily  Your physician wants you to follow-up in: 6 months with Dr. Rennis GoldenHilty. You will receive a reminder letter in the mail two months in advance. If you don't receive a letter, please call our office to schedule the follow-up appointment.  ** your April 24 visit has been cancelled

## 2017-03-23 NOTE — Progress Notes (Signed)
OFFICE CONSULT NOTE  Chief Complaint:  Vasovagal syncope  Primary Care Physician: Loletta Specter, PA-C  HPI:   Hayden Webb is a 22 y.o. male who is being seen today for the evaluation of syncope at the request of Loletta Specter, PA-C. Hayden Webb has no chronic medical problems. He is accompanied by his mother who reports he has had 3-4 syncopal episodes over the past several years. None were associated with exertion. All of the episodes had a clear prodrome of dizziness, nausea and blurred vision followed by syncope. He has had tachycardia in the past, but could not clearly state that tachycardia preceded these events. He was seen by his PCP recently and noted to be hypertensive and tachycardic in the office. He was started on a relatively higher dose of coreg at 12.5 mg BID. He seems to tolerate it and his HR has improved. He has not had further syncopal events since starting the medication.  03/23/2017  [name] returns today for follow-up.  Unfortunately had another syncopal episode.  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  Afterwards his friend was giving a speech and he was standing up at the front of the room, leaning on the wall.  He felt a little dizzy and had about 20-30 seconds of symptoms and then passed out.  As previously mentioned he was placed on a really high dose of the carvedilol 12.5 mg twice daily.  This certainly helped with inappropriate tachycardia however I think does leave him somewhat over beta blocked.  In addition carvedilol has a lot of loose atrophic properties and is more likely to cause hypotension.  This would not typically be my choice for inappropriate tachycardia in patients with vasovagal syncope.  I discussed this with the family and [name] today.  His PCP recently decreased his dose down to 6.25 twice a day.  PMHx:  Past Medical History:  Diagnosis Date  . Syncope and collapse     No past surgical history on file.  FAMHx:   Family History  Problem Relation Age of Onset  . Heart attack Maternal Grandmother   . Heart attack Maternal Grandfather   . Heart attack Paternal Grandmother   . Heart attack Paternal Grandfather     SOCHx:   reports that  has never smoked. he has never used smokeless tobacco. He reports that he does not drink alcohol or use drugs.  ALLERGIES:  No Known Allergies  ROS: Pertinent items noted in HPI and remainder of comprehensive ROS otherwise negative.  HOME MEDS: Current Outpatient Medications on File Prior to Visit  Medication Sig Dispense Refill  . carvedilol (COREG) 6.25 MG tablet Take 1 tablet (6.25 mg total) by mouth 2 (two) times daily with a meal. 30 tablet 2   No current facility-administered medications on file prior to visit.     LABS/IMAGING: No results found for this or any previous visit (from the past 48 hour(s)). No results found.  LIPID PANEL: No results found for: CHOL, TRIG, HDL, CHOLHDL, VLDL, LDLCALC, LDLDIRECT   WEIGHTS: Wt Readings from Last 3 Encounters:  03/23/17 140 lb (63.5 kg)  03/20/17 141 lb (64 kg)  03/02/17 140 lb 3.2 oz (63.6 kg)    VITALS: BP 112/68   Pulse 91   Ht 5\' 10"  (1.778 m)   Wt 140 lb (63.5 kg)   SpO2 100%   BMI 20.09 kg/m   EXAM: General appearance: alert and no distress Neck: no  carotid bruit, no JVD and thyroid not enlarged, symmetric, no tenderness/mass/nodules Lungs: clear to auscultation bilaterally Heart: regular rate and rhythm, S1, S2 normal, no murmur, click, rub or gallop Abdomen: soft, non-tender; bowel sounds normal; no masses,  no organomegaly Extremities: extremities normal, atraumatic, no cyanosis or edema Pulses: 2+ and symmetric Skin: Skin color, texture, turgor normal. No rashes or lesions Neurologic: Grossly normal Psych: Pleasant  EKG: Deferred  ASSESSMENT: 1. Vasovagal syncope 2. ?inappropriate tachycardia  PLAN: 1.   [name] had another episode of vasovagal syncope.  This episode  was shortly after giving a speech.  He was standing still for a period of time which may have provoked the symptoms.  I believe his beta-blocker is probably playing somewhat of a role in this.  Although it has helped with his inappropriate tachycardia it is more likely to cause hypotension.  Options include discontinuing it or switching to a more selective beta-blocker.  We also could consider off label use of ivabradine.  For now, I will switch him to Toprol-XL 12.5 mg once daily.  He is encouraged to continue with hydration and symptom awareness of these episodes.  Liberalizing sodium intake is also suggested.  Follow-up in 6 months.  Chrystie NoseKenneth C. Hilty, MD, [facility] StoughtonFACC, FACP  [place]  [facility]  Medical Director of the [facility] Diplomate of the American Board of Clinical Lipidology Attending Cardiologist  Direct Dial: [phone]  Fax: [phone]  Website:  [email or web address]  Kenneth C Hilty 03/23/2017, 9:18 AM

## 2017-04-17 ENCOUNTER — Encounter (INDEPENDENT_AMBULATORY_CARE_PROVIDER_SITE_OTHER): Payer: Self-pay | Admitting: Physician Assistant

## 2017-04-17 ENCOUNTER — Ambulatory Visit (INDEPENDENT_AMBULATORY_CARE_PROVIDER_SITE_OTHER): Payer: BLUE CROSS/BLUE SHIELD | Admitting: Physician Assistant

## 2017-04-17 VITALS — BP 137/74 | HR 97 | Temp 97.9°F | Resp 18 | Ht 70.0 in | Wt 140.0 lb

## 2017-04-17 DIAGNOSIS — Z114 Encounter for screening for human immunodeficiency virus [HIV]: Secondary | ICD-10-CM

## 2017-04-17 DIAGNOSIS — K5900 Constipation, unspecified: Secondary | ICD-10-CM

## 2017-04-17 DIAGNOSIS — R55 Syncope and collapse: Secondary | ICD-10-CM | POA: Diagnosis not present

## 2017-04-17 MED ORDER — DOCUSATE SODIUM 50 MG PO CAPS: 50.0000 mg | ORAL_CAPSULE | Freq: Two times a day (BID) | ORAL | 5 refills | Status: DC

## 2017-04-17 NOTE — Patient Instructions (Signed)
Syncope Syncope is when you temporarily lose consciousness. Syncope may also be called fainting or passing out. It is caused by a sudden decrease in blood flow to the brain. Even though most causes of syncope are not dangerous, syncope can be a sign of a serious medical problem. Signs that you may be about to faint include:  Feeling dizzy or light-headed.  Feeling nauseous.  Seeing all white or all black in your field of vision.  Having cold, clammy skin.  If you fainted, get medical help right away.Call your local emergency services (911 in the [place] Do not drive yourself to the hospital. Follow these instructions at home: Pay attention to any changes in your symptoms. Take these actions to help with your condition:  Have someone stay with you until you feel stable.  Do not drive, use machinery, or play sports until your health care provider says it is okay.  Keep all follow-up visits as told by your health care provider. This is important.  If you start to feel like you might faint, lie down right away and raise (elevate) your feet above the level of your heart. Breathe deeply and steadily. Wait until all of the symptoms have passed.  Drink enough fluid to keep your urine clear or pale yellow.  If you are taking blood pressure or heart medicine, get up slowly and take several minutes to sit and then stand. This can reduce dizziness.  Take over-the-counter and prescription medicines only as told by your health care provider.  Get help right away if:  You have a severe headache.  You have unusual pain in your chest, abdomen, or back.  You are bleeding from your mouth or rectum, or you have black or tarry stool.  You have a very fast or irregular heartbeat (palpitations).  You have pain with breathing.  You faint once or repeatedly.  You have a seizure.  You are confused.  You have trouble walking.  You have severe weakness.  You have vision problems. These  symptoms may represent a serious problem that is an emergency. Do not wait to see if your symptoms will go away. Get medical help right away. Call your local emergency services (911 in the [place] Do not drive yourself to the hospital. This information is not intended to replace advice given to you by your health care provider. Make sure you discuss any questions you have with your health care provider. Document Released: 02/06/2005 Document Revised: 07/15/2015 Document Reviewed: 10/21/2014 Elsevier Interactive Patient Education  2018 Elsevier Inc.  

## 2017-04-17 NOTE — Progress Notes (Addendum)
Subjective:  Patient ID: Hayden Webb, male    DOB: 05/03/1995  Age: 22 y.o. MRN: [id number]  CC: syncope  HPI Hayden Webb is a 22 y.o. male with a medical history of syncope and collapse presents on f/u of syncope. Went to the cardiologist and was told that his syncope was vasovagal. No imaging or testing done at cardiology. Taken off carvedilol and prescribed Metoprolol. Last syncopal episode was 03/20/17. Said he had a mid-term test at his school today and felt very nervous. Was so nervous that he vomited. Has chronic constipation but thinks this is unrelated to today's symptoms. Does not think he has anxiety at other times. Does not endorse CP, palpitations, SOB, HA, tingling, numbness, abdominal pain, f/c/n/v, or GU sxs.       Outpatient Medications Prior to Visit  Medication Sig Dispense Refill  . metoprolol succinate (TOPROL-XL) 25 MG 24 hr tablet Take 0.5 tablets (12.5 mg total) by mouth daily. 15 tablet 11  . carvedilol (COREG) 6.25 MG tablet Take 1 tablet (6.25 mg total) by mouth 2 (two) times daily with a meal. (Patient not taking: Reported on 04/17/2017) 30 tablet 2   No facility-administered medications prior to visit.      ROS Review of Systems  Constitutional: Negative for chills, fever and malaise/fatigue.  Eyes: Negative for blurred vision.  Respiratory: Negative for shortness of breath.   Cardiovascular: Negative for chest pain and palpitations.  Gastrointestinal: Negative for abdominal pain and nausea.  Genitourinary: Negative for dysuria and hematuria.  Musculoskeletal: Negative for joint pain and myalgias.  Skin: Negative for rash.  Neurological: Negative for tingling and headaches.  Psychiatric/Behavioral: Negative for depression. The patient is not nervous/anxious.     Objective:  BP 137/74 (BP Location: Left Arm, Patient Position: Sitting, Cuff Size: Normal)   Pulse 97   Temp 97.9 F (36.6 C) (Oral)   Resp 18   Ht 5\' 10"  (1.778 m)   Wt 140 lb  (63.5 kg)   SpO2 100%   BMI 20.09 kg/m   BP/Weight 04/17/2017 03/23/2017 03/20/2017  Systolic BP 137 112 141  Diastolic BP 74 68 77  Wt. (Lbs) 140 140 141  BMI 20.09 20.09 20.23      Physical Exam  Constitutional: He is oriented to person, place, and time.  Well developed, well nourished, NAD, polite  HENT:  Head: Normocephalic and atraumatic.  Eyes: No scleral icterus.  Neck: Normal range of motion. Neck supple. No thyromegaly present.  Cardiovascular: Normal rate, regular rhythm and normal heart sounds.  Pulmonary/Chest: Effort normal and breath sounds normal.  Musculoskeletal: He exhibits no edema.  Neurological: He is alert and oriented to person, place, and time. No cranial nerve deficit. Coordination normal.  Skin: Skin is warm and dry. No rash noted. No erythema. No pallor.  Psychiatric: He has a normal mood and affect. His behavior is normal. Thought content normal.  Vitals reviewed.    Assessment & Plan:   1. Syncope, unspecified syncope type - Vasovagal per cardiologist. However, no testing was done to rule out other cardiac etiologies of syncope. I am referring elsewhere.  - CBC with Differential - Comprehensive metabolic panel - Urgent Ambulatory referral to Cardiology. Sent to same cardiology practice as his mother goes to as per her request.   2. Screening for HIV (human immunodeficiency virus) - HIV antibody  3. Constipation, unspecified constipation type - Begin docusate sodium (COLACE) 50 MG capsule; Take 1 capsule (50 mg total) by mouth 2 (two)  times daily.  Dispense: 10 capsule; Refill: 5   Meds ordered this encounter  Medications  . docusate sodium (COLACE) 50 MG capsule    Sig: Take 1 capsule (50 mg total) by mouth 2 (two) times daily.    Dispense:  10 capsule    Refill:  5    Order Specific Question:   Supervising Provider    Answer:   Quentin AngstJEGEDE, OLUGBEMIGA E [id number]]    Follow-up: Return in about 4 weeks (around 05/15/2017).   Loletta Specteroger David  Jaquelinne Glendening PA

## 2017-04-17 NOTE — Addendum Note (Signed)
Addended by: Sindy MessingGOMEZ, Lachlan Mckim D on: 04/17/2017 05:34 PM   Modules accepted: Orders

## 2017-04-18 ENCOUNTER — Telehealth (INDEPENDENT_AMBULATORY_CARE_PROVIDER_SITE_OTHER): Payer: Self-pay | Admitting: *Deleted

## 2017-04-18 LAB — COMPREHENSIVE METABOLIC PANEL
ALT: 14 IU/L (ref 0–44)
AST: 14 IU/L (ref 0–40)
Albumin/Globulin Ratio: 1.4 (ref 1.2–2.2)
Albumin: 4.8 g/dL (ref 3.5–5.5)
Alkaline Phosphatase: 58 IU/L (ref 39–117)
BUN/Creatinine Ratio: 9 (ref 9–20)
BUN: 11 mg/dL (ref 6–20)
Bilirubin Total: 0.3 mg/dL (ref 0.0–1.2)
CALCIUM: 9.9 mg/dL (ref 8.7–10.2)
CO2: 23 mmol/L (ref 20–29)
CREATININE: 1.19 mg/dL (ref 0.76–1.27)
Chloride: 101 mmol/L (ref 96–106)
GFR calc Af Amer: 101 mL/min/{1.73_m2} (ref >59)
GFR, EST NON AFRICAN AMERICAN: 87 mL/min/{1.73_m2} (ref >59)
GLOBULIN, TOTAL: 3.4 g/dL (ref 1.5–4.5)
Glucose: 98 mg/dL (ref 65–99)
Potassium: 4 mmol/L (ref 3.5–5.2)
SODIUM: 142 mmol/L (ref 134–144)
Total Protein: 8.2 g/dL (ref 6.0–8.5)

## 2017-04-18 LAB — CBC WITH DIFFERENTIAL/PLATELET
Basophils Absolute: 0 10*3/uL (ref 0.0–0.2)
Basos: 0 %
EOS (ABSOLUTE): 0.1 10*3/uL (ref 0.0–0.4)
EOS: 2 %
HEMATOCRIT: 39.6 % (ref 37.5–51.0)
Hemoglobin: 12.9 g/dL — ABNORMAL LOW (ref 13.0–17.7)
IMMATURE GRANULOCYTES: 0 %
Immature Grans (Abs): 0 10*3/uL (ref 0.0–0.1)
Lymphocytes Absolute: 1.3 10*3/uL (ref 0.7–3.1)
Lymphs: 38 %
MCH: 25.5 pg — ABNORMAL LOW (ref 26.6–33.0)
MCHC: 32.6 g/dL (ref 31.5–35.7)
MCV: 78 fL — ABNORMAL LOW (ref 79–97)
MONOS ABS: 0.2 10*3/uL (ref 0.1–0.9)
Monocytes: 6 %
NEUTROS PCT: 54 %
Neutrophils Absolute: 1.9 10*3/uL (ref 1.4–7.0)
Platelets: 265 10*3/uL (ref 150–379)
RBC: 5.06 x10E6/uL (ref 4.14–5.80)
RDW: 15.4 % (ref 12.3–15.4)
WBC: 3.4 10*3/uL (ref 3.4–10.8)

## 2017-04-18 LAB — HIV ANTIBODY (ROUTINE TESTING W REFLEX): HIV Screen 4th Generation wRfx: NONREACTIVE

## 2017-04-18 NOTE — Telephone Encounter (Signed)
-----   Message from Loletta Specteroger David Gomez, PA-C sent at 04/18/2017  8:36 AM EST ----- HIV negative. Very slight anemia. Take a daily multivitamin with iron. Rest of labs normal.

## 2017-04-18 NOTE — Telephone Encounter (Signed)
Medical Assistant left message on patient's home and cell voicemail. Voicemail states to [place] [facility] [place] [facility] at [phone]. !!!Please inform patient of HIV being negative and being slight anemic and needing to take a multivitamin with iron daily. All other labs are normal!!!

## 2017-04-24 ENCOUNTER — Telehealth: Payer: Self-pay | Admitting: Internal Medicine

## 2017-04-24 NOTE — Telephone Encounter (Signed)
After reviewing his chart I think if he wants to switch he should be switched to Dr. Graciela HusbandsKlein given his syncope

## 2017-04-24 NOTE — Telephone Encounter (Signed)
Informed patient's mother that Dr. Rennis GoldenHilty said that it was ok if son wanted to switch to cardiologist, Dr. Mayford Knifeurner.  Dr Mayford Knifeurner recommended Dr. Graciela HusbandsKlein.. Patient's mother was ok with that and will call to make an appt with Dr. Graciela HusbandsKlein

## 2017-04-24 NOTE — Telephone Encounter (Signed)
04-24-17 per Casimiro NeedleMichael in triage-pt's mom [facility], per Mayford Knifeurner pt needs to see Graciela HusbandsKlein due to diagnosis of Syncope, ok with mom, she will call to schedule, I will send message to both providers requesting switch

## 2017-04-24 NOTE — Telephone Encounter (Signed)
Liborio NixonJanice (Patient mother) calling,   Patient would like to make switch from Dr.Hilty to Dr. Mayford Knifeurner, as Dr. Mayford Knifeurner is patient's mother cardiologist. Would the switch be okay?

## 2017-04-24 NOTE — Telephone Encounter (Signed)
Ok with me.  Dr H 

## 2017-05-02 NOTE — Telephone Encounter (Signed)
LMTCB  Per last MD visit with Dr. Rennis GoldenHilty, no testing was ordered. On 04/24/17, it was asked that patient changed from Dr. Rennis GoldenHilty to Dr. Mayford Knifeurner, who suggested Dr. Graciela HusbandsKlein. Per chart review, Dr. Lily KocherGomez ordered event monitor

## 2017-05-02 NOTE — Telephone Encounter (Signed)
New message     Patient mother called wants patient to have an echo done not a heart monitor, does not feel that a heart monitor will tell what is going on with her son.

## 2017-05-15 ENCOUNTER — Ambulatory Visit (INDEPENDENT_AMBULATORY_CARE_PROVIDER_SITE_OTHER): Payer: BLUE CROSS/BLUE SHIELD | Admitting: Physician Assistant

## 2017-05-15 ENCOUNTER — Encounter (INDEPENDENT_AMBULATORY_CARE_PROVIDER_SITE_OTHER): Payer: Self-pay | Admitting: Physician Assistant

## 2017-05-15 ENCOUNTER — Other Ambulatory Visit: Payer: Self-pay

## 2017-05-15 VITALS — BP 141/80 | HR 89 | Temp 98.1°F | Ht 70.0 in | Wt 144.4 lb

## 2017-05-15 DIAGNOSIS — Z87898 Personal history of other specified conditions: Secondary | ICD-10-CM

## 2017-05-15 NOTE — Progress Notes (Signed)
Subjective:  Patient ID: Hayden HartshornJoshua L Coffield, male    DOB: 11/08/1995  Age: 22 y.o. MRN: [id number]  CC: syncope  HPI Hayden Webb is a 22 y.o. male with a medical history of syncope and collapse presents on f/u of syncope. Has not seen cardiologist yet. Mother had requested a change from Dr. Rennis GoldenHilty to Dr. Mayford Knifeurner (her own cardiologist). However, the cardiology practice decided patient would be best served by Dr. Graciela HusbandsKlein. Mother somewhat upset at the length of time scheduling is taking and called personally to obtain service. Mother requested that an US be done done instead of a monitor. Mother says nothing will be found in the heart monitor. Patient has not had any other episode of syncope since 04/17/17. Mother is fearful since her mother and father died of MI. Heart disease and Lupus also found in family. Pt states he feels well. Takes metoprolol xr as directed. Denies CP, palpitations, SOB, HA, tingling, numbness, PND, orthopnea, presyncope, claudication, abdominal pain, f/c/n/v, rash, depression, anxiety, or GI/GU sxs.      Outpatient Medications Prior to Visit  Medication Sig Dispense Refill  . metoprolol succinate (TOPROL-XL) 25 MG 24 hr tablet Take 0.5 tablets (12.5 mg total) by mouth daily. 15 tablet 11  . docusate sodium (COLACE) 50 MG capsule Take 1 capsule (50 mg total) by mouth 2 (two) times daily. (Patient not taking: Reported on 05/15/2017) 10 capsule 5   No facility-administered medications prior to visit.      ROS Review of Systems  Constitutional: Negative for chills, fever and malaise/fatigue.  Eyes: Negative for blurred vision.  Respiratory: Negative for shortness of breath.   Cardiovascular: Negative for chest pain and palpitations.  Gastrointestinal: Negative for abdominal pain and nausea.  Genitourinary: Negative for dysuria and hematuria.  Musculoskeletal: Negative for joint pain and myalgias.  Skin: Negative for rash.  Neurological: Negative for tingling and headaches.   Psychiatric/Behavioral: Negative for depression. The patient is not nervous/anxious.     Objective:  BP (!) 142/8 (BP Location: Right Arm, Patient Position: Sitting, Cuff Size: Normal)   Pulse 89   Temp 98.1 F (36.7 C) (Oral)   Ht 5\' 10"  (1.778 m)   Wt 144 lb 6.4 oz (65.5 kg)   SpO2 99%   BMI 20.72 kg/m   BP/Weight 05/15/2017 04/17/2017 03/23/2017  Systolic BP 142 137 112  Diastolic BP 8 74 68  Wt. (Lbs) 144.4 140 140  BMI 20.72 20.09 20.09      Physical Exam  Constitutional: He is oriented to person, place, and time.  Well developed, well nourished, NAD, polite  HENT:  Head: Normocephalic and atraumatic.  Eyes: No scleral icterus.  Neck: Normal range of motion. Neck supple. No thyromegaly present.  Cardiovascular: Normal rate, regular rhythm and normal heart sounds.  No carotid bruits  Pulmonary/Chest: Effort normal and breath sounds normal. No respiratory distress. He has no wheezes.  Musculoskeletal: He exhibits no edema.  Neurological: He is alert and oriented to person, place, and time. No cranial nerve deficit. Coordination normal.  Skin: Skin is warm and dry. No rash noted. No erythema. No pallor.  Psychiatric: He has a normal mood and affect. His behavior is normal. Thought content normal.  Vitals reviewed.    Assessment & Plan:   1. History of syncope - Sedimentation Rate - C-reactive protein - ANA w/Reflex - Pt and his mother advised to keep cardiology appt and to adhere to cardiologist's advise.     Follow-up:  After cardiology appt.  Clent Demark PA

## 2017-05-16 LAB — ANA W/REFLEX: Anti Nuclear Antibody(ANA): NEGATIVE

## 2017-05-16 LAB — SEDIMENTATION RATE: Sed Rate: 23 mm/hr — ABNORMAL HIGH (ref 0–15)

## 2017-05-16 LAB — C-REACTIVE PROTEIN: CRP: <0.3 mg/L (ref 0.0–4.9)

## 2017-05-18 ENCOUNTER — Telehealth (INDEPENDENT_AMBULATORY_CARE_PROVIDER_SITE_OTHER): Payer: Self-pay

## 2017-05-18 NOTE — Telephone Encounter (Signed)
Patient aware very mild sign of chronic inflammation but no autoimmune disease, continue on current treatment regimen(plan). Maryjean Mornempestt S Roberts, CMA

## 2017-05-18 NOTE — Telephone Encounter (Signed)
-----   Message from Loletta Specteroger David Gomez, PA-C sent at 05/18/2017  1:10 PM EDT ----- Very mild sign of chronic inflammation. No autoimmune disease. Keep current treatment regimen.

## 2017-06-07 ENCOUNTER — Ambulatory Visit: Payer: BLUE CROSS/BLUE SHIELD | Admitting: Internal Medicine

## 2017-06-07 VITALS — BP 164/68 | HR 95 | Ht 70.0 in | Wt 144.6 lb

## 2017-06-07 DIAGNOSIS — R000 Tachycardia, unspecified: Secondary | ICD-10-CM | POA: Diagnosis not present

## 2017-06-07 DIAGNOSIS — R55 Syncope and collapse: Secondary | ICD-10-CM

## 2017-06-07 MED ORDER — METOPROLOL SUCCINATE ER 25 MG PO TB24: 25.0000 mg | ORAL_TABLET | Freq: Every day | ORAL | 11 refills | Status: DC

## 2017-06-07 NOTE — Progress Notes (Signed)
ELECTROPHYSIOLOGY CONSULT NOTE  Patient ID: Hayden Webb, MRN: [id number], DOB/AGE: 1996/01/12 21 y.o. Admit date: (Not on file) Date of Consult: 06/07/2017  Primary Physician: Loletta Specter, PA-C Primary Cardiologist: Susa Day is a 22 y.o. male who is being seen today for the evaluation of recurrent syncope  at the request of Hayden Webb.    HPI Hayden Webb is a 22 y.o. male referred for recurrent LOC  He has a history of recurrent syncope that dates back to when he was about 22 years old.  These episodes have occurred relatively infrequently.  They have all been associated as best as he remembers being abrupt in onset without prodrome and without recovery symptoms.  On more in-depth questioning, he has had  episodes of abrupt onset tachypalpitations that lasted about 5 seconds   And these tachypalpitations seem to be associated with the syncopal episodes just prior to loss of consciousness.  He carries a diagnosis of vasovagal syncope.  The history outlined in prior notes is different from what is garnered today.  Specifically, there is no prodrome.  There is no epiphenomenon to suggest vasovagal syncope.  He is fit and without exercise intolerance issues.  He is also been noted to be hypertensive.  Creatinine is normal potassium and bicarb are normal.  I do not see urinalysis nor imaging of his kidneys.   Past Medical History:  Diagnosis Date  . Syncope and collapse       Surgical History: No past surgical history on file.   Home Meds: Prior to Admission medications   Medication Sig Start Date End Date Taking? Authorizing Provider  metoprolol succinate (TOPROL-XL) 25 MG 24 hr tablet Take 0.5 tablets (12.5 mg total) by mouth daily. 03/23/17   Hilty, Lisette Abu, MD    Allergies: No Known Allergies  Social History   Socioeconomic History  . Marital status: Single    Spouse name: Not on file  . Number of children: Not on file  . Years of education: Not on  file  . Highest education level: Not on file  Occupational History  . Not on file  Social Needs  . Financial resource strain: Not on file  . Food insecurity:    Worry: Not on file    Inability: Not on file  . Transportation needs:    Medical: Not on file    Non-medical: Not on file  Tobacco Use  . Smoking status: Never Smoker  . Smokeless tobacco: Never Used  Substance and Sexual Activity  . Alcohol use: No  . Drug use: No  . Sexual activity: Never    Birth control/protection: Abstinence  Lifestyle  . Physical activity:    Days per week: Not on file    Minutes per session: Not on file  . Stress: Not on file  Relationships  . Social connections:    Talks on phone: Not on file    Gets together: Not on file    Attends religious service: Not on file    Active member of club or organization: Not on file    Attends meetings of clubs or organizations: Not on file    Relationship status: Not on file  . Intimate partner violence:    Fear of current or ex partner: Not on file    Emotionally abused: Not on file    Physically abused: Not on file    Forced sexual activity: Not on file  Other Topics  Concern  . Not on file  Social History Narrative  . Not on file     Family History  Problem Relation Age of Onset  . Heart attack Maternal Grandmother   . Heart attack Maternal Grandfather   . Heart attack Paternal Grandmother   . Heart attack Paternal Grandfather     ROS:  Please see the history of present illness.     All other systems reviewed and negative.    Physical Exam: Blood pressure (!) 164/68, pulse 95, height 5\' 10"  (1.778 m), weight 144 lb 9.6 oz (65.6 kg), SpO2 99 %. General: Well developed, well nourished male in no acute distress. Head: Normocephalic, atraumatic, sclera non-icteric, no xanthomas, nares are without discharge. EENT: normal  Lymph Nodes:  none Neck: Negative for carotid bruits. JVD not elevated. Back:without scoliosis kyphosis  Lungs: Clear  bilaterally to auscultation without wheezes, rales, or rhonchi. Breathing is unlabored. Heart: RRR with S1 S2. No   murmur . No rubs, or gallops appreciated. Abdomen: Soft, non-tender, non-distended with normoactive bowel sounds. No hepatomegaly. No rebound/guarding. No obvious abdominal masses or bruits Msk:  Strength and tone appear normal for age. Extremities: No clubbing or cyanosis. No edema.  Distal pedal pulses are 2+ and equal bilaterally. Skin: Warm and Dry Neuro: Alert and oriented X 3. CN III-XII intact Grossly normal sensory and motor function . Psych:  Responds to questions appropriately with a normal affect.      Labs: Cardiac Enzymes No results for input(s): CKTOTAL, CKMB, TROPONINI in the last 72 hours. CBC Lab Results  Component Value Date   WBC 3.4 04/17/2017   HGB 12.9 (L) 04/17/2017   HCT 39.6 04/17/2017   MCV 78 (L) 04/17/2017   PLT 265 04/17/2017   PROTIME: No results for input(s): LABPROT, INR in the last 72 hours. Chemistry No results for input(s): NA, K, CL, CO2, BUN, CREATININE, CALCIUM, PROT, BILITOT, ALKPHOS, ALT, AST, GLUCOSE in the last 168 hours.  Invalid input(s): LABALBU Lipids No results found for: CHOL, HDL, LDLCALC, TRIG BNP No results found for: PROBNP Thyroid Function Tests: No results for input(s): TSH, T4TOTAL, T3FREE, THYROIDAB in the last 72 hours.  Invalid input(s): FREET3 Miscellaneous No results found for: DDIMER  Radiology/Studies:  No results found.  EKG: ECG demonstrates sinus rhythm rate 95 there is interval 14/08/33 Axis 79   Assessment and Plan:   Syncope  Tachypalpitations  Hypertension-grade 2   The patient has had recurrent syncope.  These were abrupt and onset and offset unassociated with epiphenomena to suggest a neurally mediated episode.  The lack of recovery symptoms speaks also includes a neurally mediated episode; however, that being said it still most likely is a neurally mediated event.  The history  of tachypalpitations is suggestive of a potential trigger.  SVT is associated with neurally mediated syncope primarily at the onset with decreased right ventricular filling.  I have suggested that we utilize a implantable loop recorder given the relative infrequency of these events to try to clarify the rhythmic trigger  His blood pressure is significant.  The issue of secondary hypertension needs to be addressed by his primary care physician.  We will undertake an echocardiogram given his history of syncope as well as to look for evidence of endorgan damage.  We will increase his metoprolol today from 12.5--25.        Sherryl MangesSteven Klein

## 2017-06-07 NOTE — Patient Instructions (Addendum)
Medication Instructions:  Your physician has recommended you make the following change in your medication:   1. Increase your Metoprolol to 25mg , one tablet, every day.  Labwork: None ordered.  Testing/Procedures: Your physician has requested that you have an echocardiogram. Echocardiography is a painless test that uses sound waves to create images of your heart. It provides your doctor with information about the size and shape of your heart and how well your heart's chambers and valves are working. This procedure takes approximately one hour. There are no restrictions for this procedure.   Follow-Up: Your physician recommends that you schedule a follow-up appointment as needed with Dr Graciela HusbandsKlein.  Any Other Special Instructions Will Be Listed Below (If Applicable).     If you need a refill on your cardiac medications before your next appointment, please call your pharmacy.

## 2017-06-13 ENCOUNTER — Ambulatory Visit: Payer: BLUE CROSS/BLUE SHIELD | Admitting: Internal Medicine

## 2017-06-14 ENCOUNTER — Other Ambulatory Visit (HOSPITAL_COMMUNITY): Payer: BLUE CROSS/BLUE SHIELD

## 2017-06-20 ENCOUNTER — Other Ambulatory Visit: Payer: Self-pay

## 2017-06-20 ENCOUNTER — Ambulatory Visit (HOSPITAL_COMMUNITY): Payer: BLUE CROSS/BLUE SHIELD | Attending: Cardiology

## 2017-06-20 DIAGNOSIS — R55 Syncope and collapse: Secondary | ICD-10-CM | POA: Insufficient documentation

## 2017-06-20 DIAGNOSIS — R000 Tachycardia, unspecified: Secondary | ICD-10-CM | POA: Diagnosis not present

## 2017-08-02 ENCOUNTER — Encounter (INDEPENDENT_AMBULATORY_CARE_PROVIDER_SITE_OTHER): Payer: Self-pay | Admitting: Physician Assistant

## 2017-08-02 ENCOUNTER — Ambulatory Visit (INDEPENDENT_AMBULATORY_CARE_PROVIDER_SITE_OTHER): Payer: BLUE CROSS/BLUE SHIELD | Admitting: Physician Assistant

## 2017-08-02 ENCOUNTER — Encounter (INDEPENDENT_AMBULATORY_CARE_PROVIDER_SITE_OTHER): Payer: Self-pay

## 2017-08-02 ENCOUNTER — Other Ambulatory Visit: Payer: Self-pay

## 2017-08-02 VITALS — Ht 70.0 in | Wt 140.8 lb

## 2017-08-02 DIAGNOSIS — Z5321 Procedure and treatment not carried out due to patient leaving prior to being seen by health care provider: Secondary | ICD-10-CM

## 2017-08-03 NOTE — Progress Notes (Signed)
Left without being seen.

## 2017-08-28 ENCOUNTER — Encounter (INDEPENDENT_AMBULATORY_CARE_PROVIDER_SITE_OTHER): Payer: Self-pay | Admitting: Physician Assistant

## 2017-08-28 ENCOUNTER — Ambulatory Visit (INDEPENDENT_AMBULATORY_CARE_PROVIDER_SITE_OTHER): Payer: BLUE CROSS/BLUE SHIELD | Admitting: Physician Assistant

## 2017-08-28 ENCOUNTER — Other Ambulatory Visit: Payer: Self-pay

## 2017-08-28 VITALS — BP 155/82 | HR 97 | Temp 98.1°F | Ht 70.0 in | Wt 140.8 lb

## 2017-08-28 DIAGNOSIS — I10 Essential (primary) hypertension: Secondary | ICD-10-CM

## 2017-08-28 MED ORDER — AMLODIPINE BESYLATE 10 MG PO TABS: 10.0000 mg | ORAL_TABLET | Freq: Every day | ORAL | 5 refills | Status: DC

## 2017-08-28 NOTE — Patient Instructions (Addendum)
I advise that you call [facility] and schedule appointment for placement of loop recorder. [facility].    Amlodipine tablets What is this medicine? AMLODIPINE (am LOE di peen) is a calcium-channel blocker. It affects the amount of calcium found in your heart and muscle cells. This relaxes your blood vessels, which can reduce the amount of work the heart has to do. This medicine is used to lower high blood pressure. It is also used to prevent chest pain. This medicine may be used for other purposes; ask your health care provider or pharmacist if you have questions. COMMON BRAND NAME(S): Norvasc What should I tell my health care provider before I take this medicine? They need to know if you have any of these conditions: -heart problems like heart failure or aortic stenosis -liver disease -an unusual or allergic reaction to amlodipine, other medicines, foods, dyes, or preservatives -pregnant or trying to get pregnant -breast-feeding How should I use this medicine? Take this medicine by mouth with a glass of water. Follow the directions on the prescription label. Take your medicine at regular intervals. Do not take more medicine than directed. Talk to your pediatrician regarding the use of this medicine in children. Special care may be needed. This medicine has been used in children as young as 6. Persons over 21 years old may have a stronger reaction to this medicine and need smaller doses. Overdosage: If you think you have taken too much of this medicine contact a poison control center or emergency room at once. NOTE: This medicine is only for you. Do not share this medicine with others. What if I miss a dose? If you miss a dose, take it as soon as you can. If it is almost time for your next dose, take only that dose. Do not take double or extra doses. What may interact with this medicine? -herbal or dietary supplements -local or general  anesthetics -medicines for high blood pressure -medicines for prostate problems -rifampin This list may not describe all possible interactions. Give your health care provider a list of all the medicines, herbs, non-prescription drugs, or dietary supplements you use. Also tell them if you smoke, drink alcohol, or use illegal drugs. Some items may interact with your medicine. What should I watch for while using this medicine? Visit your doctor or health care professional for regular check ups. Check your blood pressure and pulse rate regularly. Ask your health care professional what your blood pressure and pulse rate should be, and when you should contact him or her. This medicine may make you feel confused, dizzy or lightheaded. Do not drive, use machinery, or do anything that needs mental alertness until you know how this medicine affects you. To reduce the risk of dizzy or fainting spells, do not sit or stand up quickly, especially if you are an older patient. Avoid alcoholic drinks; they can make you more dizzy. Do not suddenly stop taking amlodipine. Ask your doctor or health care professional how you can gradually reduce the dose. What side effects may I notice from receiving this medicine? Side effects that you should report to your doctor or health care professional as soon as possible: -allergic reactions like skin rash, itching or hives, swelling of the face, lips, or tongue -breathing problems -changes in vision or hearing -chest pain -fast, irregular heartbeat -swelling of legs or ankles Side effects that usually do not require medical attention (report to your doctor or health  care professional if they continue or are bothersome): -dry mouth -facial flushing -nausea, vomiting -stomach gas, pain -tired, weak -trouble sleeping This list may not describe all possible side effects. Call your doctor for medical advice about side effects. You may report side effects to FDA at  1-800-FDA-1088. Where should I keep my medicine? Keep out of the reach of children. Store at room temperature between 59 and 86 degrees F (15 and 30 degrees C). Protect from light. Keep container tightly closed. Throw away any unused medicine after the expiration date. NOTE: This sheet is a summary. It may not cover all possible information. If you have questions about this medicine, talk to your doctor, pharmacist, or health care provider.  2018 Elsevier/Gold Standard (2012-01-05 11:40:58)

## 2017-08-28 NOTE — Progress Notes (Signed)
Subjective:  Patient ID: Hayden Webb, male    DOB: 08/23/1995  Age: 22 y.o. MRN: [id number]  CC: f/u cardiology   HPI Hayden Webb is a 22 y.o. male with a medical history of HTN and syncope/collapse presents to f/u on HTN and syncope. Has not had a syncopal episode since last being seen here four months ago. Saw Dr. Graciela Husbands at [facility] and had been advised to have a loop recorder placed. Pt's mother, whom accompanies pt today, says she was not told or called about loop recorder placement. BP at cardiologist's office was noted to be 164/68 mmHg and heart rate at 95 bpm. Cardiologist increased metoprolol from 12.5 mg to 25 mg. BP 155/82 mmHg and heart rate 97 today in clinic. Pt states he takes medications as directed. Denies side effects of medication. Denies CP, palpitations, SOB, HA, tingling, numbness, presyncope, syncope, abdominal pain, f/c/n/v, or GI/GU sxs.        Outpatient Medications Prior to Visit  Medication Sig Dispense Refill  . metoprolol succinate (TOPROL-XL) 25 MG 24 hr tablet Take 1 tablet (25 mg total) by mouth daily. 30 tablet 11   No facility-administered medications prior to visit.      ROS Review of Systems  Constitutional: Negative for chills, fever and malaise/fatigue.  Eyes: Negative for blurred vision.  Respiratory: Negative for shortness of breath.   Cardiovascular: Negative for chest pain and palpitations.  Gastrointestinal: Negative for abdominal pain and nausea.  Genitourinary: Negative for dysuria and hematuria.  Musculoskeletal: Negative for joint pain and myalgias.  Skin: Negative for rash.  Neurological: Negative for tingling and headaches.  Psychiatric/Behavioral: Negative for depression. The patient is not nervous/anxious.     Objective:  BP (!) 155/82 (BP Location: Right Arm, Patient Position: Sitting, Cuff Size: Normal)   Pulse 97   Temp 98.1 F (36.7 C) (Oral)   Ht 5\' 10"  (1.778 m)   Wt 140 lb 12.8 oz (63.9 kg)   SpO2 100%    BMI 20.20 kg/m   BP/Weight 08/28/2017 08/02/2017 06/07/2017  Systolic BP 155 - 164  Diastolic BP 82 - 68  Wt. (Lbs) 140.8 140.8 144.6  BMI 20.2 20.2 20.75      Physical Exam  Constitutional: He is oriented to person, place, and time.  Well developed, well nourished, NAD, polite  HENT:  Head: Normocephalic and atraumatic.  Eyes: No scleral icterus.  Neck: Normal range of motion. Neck supple. No thyromegaly present.  Cardiovascular: Normal rate, regular rhythm and normal heart sounds.  Pulmonary/Chest: Effort normal and breath sounds normal.  Musculoskeletal: He exhibits no edema.  Neurological: He is alert and oriented to person, place, and time.  Skin: Skin is warm and dry. No rash noted. No erythema. No pallor.  Psychiatric: He has a normal mood and affect. His behavior is normal. Thought content normal.  Vitals reviewed.    Assessment & Plan:   1. Hypertension, unspecified type - amLODipine (NORVASC) 10 MG tablet; Take 1 tablet (10 mg total) by mouth daily.  Dispense: 30 tablet; Refill: 5. Advised to start with half tablet for one week, then to begin whole tablet if blood pressure is still above 130/85 mmHg.  - Continue metoprolol    *Pt's mother upset she was not informed or called from [facility] for appointment for loop recorder placement. I advised she call [facility] to schedule appointment.     Meds ordered this encounter  Medications  . amLODipine (NORVASC) 10 MG tablet  Sig: Take 1 tablet (10 mg total) by mouth daily.    Dispense:  30 tablet    Refill:  5    Order Specific Question:   Supervising Provider    Answer:   Hoy RegisterNEWLIN, ENOBONG [[id number]]    Follow-up: Return in about 1 month (around 09/25/2017) for HTN.   Loletta Specteroger David Gomez PA

## 2017-08-30 ENCOUNTER — Telehealth: Payer: Self-pay | Admitting: Internal Medicine

## 2017-08-30 NOTE — Telephone Encounter (Signed)
New Message:      Pt's mother is calling and states that his primary doctor is saying that the pt needs to get stents done. Pt's mother is confused as of to why he may think this if the test that was ran were okay.

## 2017-08-30 NOTE — Telephone Encounter (Signed)
Spoke with pt's mother. Unfortunately pt has not filled out a DPR which will allow me to release information to his mother. I have advised his mother to have pt come by the office to fill out a DPR.   Pt's mother has questions regarding a possible linq implant. I explained to mother Dr Graciela HusbandsKlein had discussed this and pt was to follow up if he wanted it placed. Pt's mother stated she would talk to her son and let us know.

## 2017-09-18 ENCOUNTER — Ambulatory Visit: Payer: BLUE CROSS/BLUE SHIELD | Admitting: Adult Health

## 2017-09-18 ENCOUNTER — Telehealth: Payer: Self-pay | Admitting: Family Medicine

## 2017-09-18 ENCOUNTER — Encounter: Payer: Self-pay | Admitting: Adult Health

## 2017-09-18 VITALS — BP 138/80 | Temp 98.4°F | Wt 141.0 lb

## 2017-09-18 DIAGNOSIS — I10 Essential (primary) hypertension: Secondary | ICD-10-CM | POA: Insufficient documentation

## 2017-09-18 DIAGNOSIS — Z7689 Persons encountering health services in other specified circumstances: Secondary | ICD-10-CM

## 2017-09-18 DIAGNOSIS — I159 Secondary hypertension, unspecified: Secondary | ICD-10-CM | POA: Diagnosis not present

## 2017-09-18 NOTE — Telephone Encounter (Signed)
Spoke to the pt and advised that Kandee KeenCory would like to get an ultrasound of his kidneys due to uncontrolled htn.  Pt agreed and order placed.  No further action required.

## 2017-09-18 NOTE — Telephone Encounter (Signed)
Called and spoke to the pt.  He states Kandee KeenCory called him earlier.  Unsure why provider was calling.  Will forward.

## 2017-09-18 NOTE — Telephone Encounter (Signed)
Copied from CRM [id number]. Topic: Inquiry >> Sep 18, 2017 12:36 PM Windy KalataMichael, Taylor L, NT wrote: Reason for CRM: [facility] and [facility]. Patient did not disclose any information with me. Please advise.

## 2017-09-18 NOTE — Progress Notes (Signed)
Patient presents to clinic today to establish care. He is a pleasant 22 year old male who  has a past medical history of Essential hypertension and Syncope and collapse.  He is a former patient of Sindy Messing, [place].   Acute Concerns: Establish Care  Chronic Issues: Essential Hypertension - currently prescribed Norvasc 10 mg, is taking 1/2 tab and Toprolol 25 mg tab. He reports that at home he has been monitoring his BP and has a majority of his readings in the 120's/80's. He does report sporadic readings in the 140/80's.   Syncope - history of syncope as yearly as 22 y/o. Often has abrupt onset. He reports that his last episode was about 6 months ago. He does have a history of tachycardia. He is seen by Cardiology, Dr. Graciela Husbands who recommended an implantable loop recorder, Sharia Reeve is not crazy about having this done. Echo done in 06/2017 showed normal function.    Health Maintenance: Dental -- Does not do routine screens Vision --  Does not do routine screen Immunizations -- UTD   Diet: He eats healthy  Exercise: Does sit up and push ups    Past Medical History:  Diagnosis Date  . Essential hypertension   . Syncope and collapse     History reviewed. No pertinent surgical history.  Current Outpatient Medications on File Prior to Visit  Medication Sig Dispense Refill  . amLODipine (NORVASC) 10 MG tablet Take 1 tablet (10 mg total) by mouth daily. 30 tablet 5  . metoprolol succinate (TOPROL-XL) 25 MG 24 hr tablet Take 1 tablet (25 mg total) by mouth daily. 30 tablet 11   No current facility-administered medications on file prior to visit.     No Known Allergies  Family History  Problem Relation Age of Onset  . Heart attack Maternal Grandmother   . Heart attack Maternal Grandfather   . Heart attack Paternal Grandmother   . Heart attack Paternal Grandfather     Social History   Socioeconomic History  . Marital status: Single    Spouse name: Not on file  . Number of  children: Not on file  . Years of education: Not on file  . Highest education level: Not on file  Occupational History  . Not on file  Social Needs  . Financial resource strain: Not on file  . Food insecurity:    Worry: Not on file    Inability: Not on file  . Transportation needs:    Medical: Not on file    Non-medical: Not on file  Tobacco Use  . Smoking status: Never Smoker  . Smokeless tobacco: Never Used  Substance and Sexual Activity  . Alcohol use: No  . Drug use: No  . Sexual activity: Never    Birth control/protection: Abstinence  Lifestyle  . Physical activity:    Days per week: Not on file    Minutes per session: Not on file  . Stress: Not on file  Relationships  . Social connections:    Talks on phone: Not on file    Gets together: Not on file    Attends religious service: Not on file    Active member of club or organization: Not on file    Attends meetings of clubs or organizations: Not on file    Relationship status: Not on file  . Intimate partner violence:    Fear of current or ex partner: Not on file    Emotionally abused: Not on file  Physically abused: Not on file    Forced sexual activity: Not on file  Other Topics Concern  . 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     Review of Systems  Constitutional: Negative.   HENT: Negative.   Eyes: Negative.   Respiratory: Negative.   Cardiovascular: Negative.   Gastrointestinal: Negative.   Genitourinary: Negative.   Skin: Negative.   Neurological: Negative.   All other systems reviewed and are negative.    BP 138/80   Temp 98.4 F (36.9 C) (Oral)   Wt 141 lb (64 kg)   BMI 20.23 kg/m   Physical Exam  Constitutional: He is oriented to person, place, and time. He appears well-developed and well-nourished.  Cardiovascular: Normal rate, regular rhythm, normal heart sounds and intact distal  pulses.  Pulmonary/Chest: Effort normal and breath sounds normal.  Neurological: He is alert and oriented to person, place, and time.  Skin: Skin is warm.  Psychiatric: He has a normal mood and affect. His behavior is normal. Judgment and thought content normal.  Nursing note and vitals reviewed.  Assessment/Plan: 1. Encounter to establish care - Follow up for CPE or sooner if needed - Encouraged heart healthy diet and frequent exercise   2. Secondary hypertension - BP seems better controlled at this time.  - Will get renal US to look for causes of hypertension in 22 y/o   Shirline Freesory Dajohn Ellender, NP

## 2017-09-18 NOTE — Telephone Encounter (Signed)
I would like to get an ultra sound of his kidneys. Just wanted to ask him if this is ok

## 2017-09-27 ENCOUNTER — Ambulatory Visit (INDEPENDENT_AMBULATORY_CARE_PROVIDER_SITE_OTHER): Payer: BLUE CROSS/BLUE SHIELD | Admitting: Physician Assistant

## 2017-10-02 ENCOUNTER — Ambulatory Visit
Admission: RE | Admit: 2017-10-02 | Discharge: 2017-10-02 | Disposition: A | Discharge status: Home / Self Care | Payer: BLUE CROSS/BLUE SHIELD | Source: Ambulatory Visit | Attending: Adult Health | Admitting: Adult Health

## 2017-10-02 DIAGNOSIS — I10 Essential (primary) hypertension: Secondary | ICD-10-CM

## 2017-10-03 ENCOUNTER — Encounter: Payer: Self-pay | Admitting: *Deleted

## 2017-10-31 ENCOUNTER — Encounter: Payer: Self-pay | Admitting: Internal Medicine

## 2017-10-31 ENCOUNTER — Ambulatory Visit (INDEPENDENT_AMBULATORY_CARE_PROVIDER_SITE_OTHER): Payer: BLUE CROSS/BLUE SHIELD | Admitting: Internal Medicine

## 2017-10-31 VITALS — BP 146/71 | HR 96 | Ht 70.0 in | Wt 140.6 lb

## 2017-10-31 DIAGNOSIS — R000 Tachycardia, unspecified: Secondary | ICD-10-CM

## 2017-10-31 DIAGNOSIS — R55 Syncope and collapse: Secondary | ICD-10-CM

## 2017-10-31 NOTE — Progress Notes (Signed)
      Patient Care Team: Shirline Frees, NP as PCP - General (Family Medicine) Rennis Golden Lisette Abu, MD as PCP - Cardiology (Cardiology)   HPI  Hayden Webb is a 22 y.o. male seen in follow-up syncope thought to be neurally mediated despite that it occurs without prodrome.  He also has a history of hypertension.  These episodes are somewhat unusual as they are abrupt in onset without prodrome without recovery symptoms.  There are palpitations and preceded.  We discussed at last visit role of an implantable loop recorder.  At this point he is family have decided to defer  He is working on hydration.  He has had no interval events.  He is being seen by PCP and is having his blood pressure treated -- renal ultrasound was normal.   Records and Results Reviewed   Past Medical History:  Diagnosis Date  . Essential hypertension   . Syncope and collapse     No past surgical history on file.  Current Meds  Medication Sig  . amLODipine (NORVASC) 10 MG tablet Take 1 tablet (10 mg total) by mouth daily.  . metoprolol succinate (TOPROL-XL) 25 MG 24 hr tablet Take 1 tablet (25 mg total) by mouth daily.    No Known Allergies    Review of Systems negative except from HPI and PMH  Physical Exam BP (!) 146/71 (BP Location: Right Arm, Cuff Size: Normal)   Pulse 96   Ht 5\' 10"  (1.778 m)   Wt 140 lb 9.6 oz (63.8 kg)   BMI 20.17 kg/m  Well developed and nourished in no acute distress HENT normal Neck supple with JVP-flat Clear Regular rate and rhythm, no murmurs or gallops Abd-soft with active BS No Clubbing cyanosis edema Skin-warm and dry A & Oriented  Grossly normal sensory and motor function  Assessment and  Plan  Syncope  Tachypalpitations  Hypertension-grade 2  Reviewed the physiology of neurally mediated syncope with the patient and his mother.  Encourage use of fluids.  Discussed again the role of the implantable loop recorder to clarify the presence or absence  of an arrhythmic trigger they would like to defer  We will see again in 9  Months    Current medicines are reviewed at length with the patient today .  The patient does not  have concerns regarding medicines.

## 2017-10-31 NOTE — Patient Instructions (Signed)
Medication Instructions:  Your physician recommends that you continue on your current medications as directed. Please refer to the Current Medication list given to you today.  Labwork: None ordered.  Testing/Procedures: None ordered.  Follow-Up: Your physician recommends that you schedule a follow-up appointment in:   9 months with Dr. Klein  Any Other Special Instructions Will Be Listed Below (If Applicable).     If you need a refill on your cardiac medications before your next appointment, please call your pharmacy.  

## 2018-01-22 ENCOUNTER — Other Ambulatory Visit (INDEPENDENT_AMBULATORY_CARE_PROVIDER_SITE_OTHER): Payer: Self-pay | Admitting: Physician Assistant

## 2018-01-22 DIAGNOSIS — I10 Essential (primary) hypertension: Secondary | ICD-10-CM

## 2018-01-22 MED ORDER — AMLODIPINE BESYLATE 10 MG PO TABS: 10.0000 mg | ORAL_TABLET | Freq: Every day | ORAL | 5 refills | Status: DC

## 2018-03-21 ENCOUNTER — Ambulatory Visit: Payer: BLUE CROSS/BLUE SHIELD | Admitting: Adult Health

## 2018-04-16 ENCOUNTER — Ambulatory Visit: Payer: BLUE CROSS/BLUE SHIELD | Admitting: Adult Health

## 2018-04-16 NOTE — Progress Notes (Deleted)
Subjective:    Patient ID: Hayden Webb, male    DOB: December 26, 1995, 23 y.o.   MRN: [id number]  HPI  23 year old male who  has a past medical history of Essential hypertension and Syncope and collapse.  He presents to the the office today for 67-month follow-up regarding essential hypertension and syncope.  He is currently taking Toprol 25 mg and Norvasc 10 mg daily.  He does on occasion and monitor his blood pressure at home and consistently has been getting readings in the 120s over 80s.  Ultrasound in 2019 was negative  Been seen by cardiology, the last being 10/31/2017 for follow-up of syncope.  Has been discussed in the past about having a implantable loop recorder placed.  At this point he and his family have decided to defer.  He denies any interval events.  His last echo was done in 06/2017 which showed normal function   Review of Systems See HPI   Past Medical History:  Diagnosis Date  . Essential hypertension   . Syncope and collapse     Social History   Socioeconomic History  . Marital status: Single    Spouse name: Not on file  . Number of children: Not on file  . Years of education: Not on file  . Highest education level: Not on file  Occupational History  . Not on file  Social Needs  . Financial resource strain: Not on file  . Food insecurity:    Worry: Not on file    Inability: Not on file  . Transportation needs:    Medical: Not on file    Non-medical: Not on file  Tobacco Use  . Smoking status: Never Smoker  . Smokeless tobacco: Never Used  Substance and Sexual Activity  . Alcohol use: No  . Drug use: No  . Sexual activity: Not Currently    Birth control/protection: Abstinence  Lifestyle  . Physical activity:    Days per week: Not on file    Minutes per session: Not on file  . Stress: Not on file  Relationships  . Social connections:    Talks on phone: Not on file    Gets together: Not on file    Attends religious service: Not on file    Active  member of club or organization: Not on file    Attends meetings of clubs or organizations: Not on file    Relationship status: Not on file  . Intimate partner violence:    Fear of current or ex partner: Not on file    Emotionally abused: Not on file    Physically abused: Not on file    Forced sexual activity: Not on file  Other Topics Concern  . 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     No past surgical history on file.  Family History  Problem Relation Age of Onset  . Heart attack Maternal Grandmother   . Heart attack Maternal Grandfather   . Heart attack Paternal Grandmother   . Heart attack Paternal Grandfather     No Known Allergies  Current Outpatient Medications on File Prior to Visit  Medication Sig Dispense Refill  . amLODipine (NORVASC) 10 MG tablet Take 1 tablet (10 mg total) by mouth daily. 30 tablet 5  . metoprolol succinate (TOPROL-XL) 25 MG 24 hr tablet Take 1 tablet (25  mg total) by mouth daily. 30 tablet 11   No current facility-administered medications on file prior to visit.     There were no vitals taken for this visit.      Objective:   Physical Exam Vitals signs and nursing note reviewed.  Constitutional:      Appearance: Normal appearance.  Cardiovascular:     Rate and Rhythm: Normal rate and regular rhythm.     Pulses: Normal pulses.     Heart sounds: Normal heart sounds.  Pulmonary:     Effort: Pulmonary effort is normal.     Breath sounds: Normal breath sounds.  Abdominal:     General: Abdomen is flat.     Palpations: Abdomen is soft.  Musculoskeletal: Normal range of motion.  Skin:    General: Skin is warm and dry.     Capillary Refill: Capillary refill takes less than 2 seconds.  Neurological:     General: No focal deficit present.     Mental Status: He is alert.  Psychiatric:        Mood and Affect: Mood normal.        Behavior:  Behavior normal.        Thought Content: Thought content normal.        Judgment: Judgment normal.           Assessment & Plan:

## 2018-04-18 ENCOUNTER — Other Ambulatory Visit: Payer: Self-pay | Admitting: Internal Medicine

## 2018-04-25 ENCOUNTER — Encounter: Payer: Self-pay | Admitting: Adult Health

## 2018-04-25 ENCOUNTER — Ambulatory Visit: Payer: BLUE CROSS/BLUE SHIELD | Admitting: Adult Health

## 2018-04-25 VITALS — BP 138/72 | Temp 98.0°F | Wt 145.0 lb

## 2018-04-25 DIAGNOSIS — I158 Other secondary hypertension: Secondary | ICD-10-CM | POA: Diagnosis not present

## 2018-04-25 LAB — BASIC METABOLIC PANEL
BUN: 18 mg/dL (ref 6–23)
CHLORIDE: 102 meq/L (ref 96–112)
CO2: 31 meq/L (ref 19–32)
Calcium: 9.7 mg/dL (ref 8.4–10.5)
Creatinine, Ser: 1.13 mg/dL (ref 0.40–1.50)
GFR: 98.34 mL/min (ref >60.00)
Glucose, Bld: 94 mg/dL (ref 70–99)
POTASSIUM: 3.8 meq/L (ref 3.5–5.1)
Sodium: 139 mEq/L (ref 135–145)

## 2018-04-25 LAB — CBC WITH DIFFERENTIAL/PLATELET
BASOS PCT: 0.4 % (ref 0.0–3.0)
Basophils Absolute: 0 10*3/uL (ref 0.0–0.1)
EOS PCT: 2.4 % (ref 0.0–5.0)
Eosinophils Absolute: 0.1 10*3/uL (ref 0.0–0.7)
HCT: 39.5 % (ref 39.0–52.0)
HEMOGLOBIN: 13 g/dL (ref 13.0–17.0)
Lymphocytes Relative: 44.1 % (ref 12.0–46.0)
Lymphs Abs: 1.6 10*3/uL (ref 0.7–4.0)
MCHC: 32.9 g/dL (ref 30.0–36.0)
MCV: 78 fl (ref 78.0–100.0)
Monocytes Absolute: 0.3 10*3/uL (ref 0.1–1.0)
Monocytes Relative: 7.1 % (ref 3.0–12.0)
NEUTROS ABS: 1.6 10*3/uL (ref 1.4–7.7)
Neutrophils Relative %: 46 % (ref 43.0–77.0)
Platelets: 235 10*3/uL (ref 150.0–400.0)
RBC: 5.06 Mil/uL (ref 4.22–5.81)
RDW: 14.7 % (ref 11.5–15.5)
WBC: 3.5 10*3/uL — ABNORMAL LOW (ref 4.0–10.5)

## 2018-04-25 NOTE — Progress Notes (Signed)
Subjective:    Patient ID: Hayden Webb, male    DOB: Apr 16, 1995, 23 y.o.   MRN: [id number]  HPI  23 year old male who  has a past medical history of Essential hypertension and Syncope and collapse.  He presents to the office today for follow up regarding hypertension. He is currently prescribed Norvasc 10 mg and Toprol 25 mg daily. He does monitor his BP periodically and has been getting readings between 120-130/80. Denies headaches, blurred vision, or dizziness.   BP Readings from Last 3 Encounters:  04/25/18 138/72  10/31/17 (!) 146/71  09/18/17 138/80     Was seen by Cardiology in September for syncope and collapse. At this time he and his family deferred a loop recorder. He denies syncopal episodes   He denies any other acute issues or concerns   Review of Systems See HPI   Past Medical History:  Diagnosis Date  . Essential hypertension   . Syncope and collapse     Social History   Socioeconomic History  . Marital status: Single    Spouse name: Not on file  . Number of children: Not on file  . Years of education: Not on file  . Highest education level: Not on file  Occupational History  . Not on file  Social Needs  . Financial resource strain: Not on file  . Food insecurity:    Worry: Not on file    Inability: Not on file  . Transportation needs:    Medical: Not on file    Non-medical: Not on file  Tobacco Use  . Smoking status: Never Smoker  . Smokeless tobacco: Never Used  Substance and Sexual Activity  . Alcohol use: No  . Drug use: No  . Sexual activity: Not Currently    Birth control/protection: Abstinence  Lifestyle  . Physical activity:    Days per week: Not on file    Minutes per session: Not on file  . Stress: Not on file  Relationships  . Social connections:    Talks on phone: Not on file    Gets together: Not on file    Attends religious service: Not on file    Active member of club or organization: Not on file    Attends meetings  of clubs or organizations: Not on file    Relationship status: Not on file  . Intimate partner violence:    Fear of current or ex partner: Not on file    Emotionally abused: Not on file    Physically abused: Not on file    Forced sexual activity: Not on file  Other Topics Concern  . 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     History reviewed. No pertinent surgical history.  Family History  Problem Relation Age of Onset  . Heart attack Maternal Grandmother   . Heart attack Maternal Grandfather   . Heart attack Paternal Grandmother   . Heart attack Paternal Grandfather     No Known Allergies  Current Outpatient Medications on File Prior to Visit  Medication Sig Dispense Refill  . amLODipine (NORVASC) 10 MG tablet Take 1 tablet (10 mg total) by mouth daily. 30 tablet 5  . metoprolol succinate (TOPROL-XL) 25 MG 24 hr tablet TAKE 1 TABLET BY MOUTH EVERY DAY 30 tablet 6   No current facility-administered medications on file prior  to visit.     BP 138/72   Temp 98 F (36.7 C)   Wt 145 lb (65.8 kg)   BMI 20.81 kg/m       Objective:   Physical Exam Vitals signs and nursing note reviewed.  Constitutional:      Appearance: Normal appearance.  Cardiovascular:     Rate and Rhythm: Normal rate and regular rhythm.     Pulses: Normal pulses.     Heart sounds: Normal heart sounds.  Pulmonary:     Effort: Pulmonary effort is normal.     Breath sounds: Normal breath sounds.  Musculoskeletal: Normal range of motion.  Skin:    General: Skin is warm and dry.  Neurological:     General: No focal deficit present.     Mental Status: He is alert and oriented to person, place, and time.  Psychiatric:        Mood and Affect: Mood normal.        Thought Content: Thought content normal.        Judgment: Judgment normal.       Assessment & Plan:  1. Other secondary hypertension -  Continue with current medication regimen  - Return precautions given  - Basic Metabolic Panel - CBC with Differential/Platelet  Shirline Frees, NP

## 2018-04-26 ENCOUNTER — Encounter: Payer: Self-pay | Admitting: Family Medicine

## 2018-07-19 ENCOUNTER — Telehealth: Payer: Self-pay

## 2018-07-19 NOTE — Telephone Encounter (Signed)
I called and left patient a message about switching office visit on 08/06/18 to telehealth visit and we may need to move patient to another day. Telehealth visits are every 30 minutes.

## 2018-07-26 NOTE — Telephone Encounter (Signed)
I called and left patient a message about switching office visit on 08/06/18 to telehealth visit.

## 2018-07-30 NOTE — Telephone Encounter (Signed)
I called and left patient a message about switching office visit to telehealth visit on 08/06/18. 

## 2018-08-02 NOTE — Telephone Encounter (Signed)
After speaking with Dr. Olin Pia nurse Rolanda Lundborg, she stated to cancel appointment since patient has not returned calls about switching office visit to telehealth visit. I called and left patient a message letting him know appointment has been cancelled and to call back if he wants to reschedule.

## 2018-08-02 NOTE — Telephone Encounter (Signed)
I called and left patient a message about switching office visit to telehealth visit on 08/06/18.

## 2018-08-06 ENCOUNTER — Ambulatory Visit: Payer: BLUE CROSS/BLUE SHIELD | Admitting: Internal Medicine

## 2018-12-03 ENCOUNTER — Other Ambulatory Visit: Payer: Self-pay | Admitting: Internal Medicine

## 2018-12-06 ENCOUNTER — Other Ambulatory Visit: Payer: Self-pay | Admitting: Family Medicine

## 2018-12-06 DIAGNOSIS — I10 Essential (primary) hypertension: Secondary | ICD-10-CM

## 2018-12-06 MED ORDER — AMLODIPINE BESYLATE 10 MG PO TABS: 10.0000 mg | ORAL_TABLET | Freq: Every day | ORAL | 1 refills | Status: DC

## 2018-12-30 ENCOUNTER — Other Ambulatory Visit: Payer: Self-pay | Admitting: Internal Medicine

## 2019-03-26 ENCOUNTER — Other Ambulatory Visit: Payer: Self-pay | Admitting: Adult Health

## 2019-03-26 DIAGNOSIS — I10 Essential (primary) hypertension: Secondary | ICD-10-CM

## 2019-03-28 NOTE — Telephone Encounter (Signed)
Patient need to schedule an ov for more refills. Temp supply sent  to pharmacy.

## 2019-07-02 ENCOUNTER — Other Ambulatory Visit: Payer: Self-pay | Admitting: Internal Medicine

## 2019-07-17 ENCOUNTER — Other Ambulatory Visit: Payer: Self-pay | Admitting: Adult Health

## 2019-07-17 DIAGNOSIS — I10 Essential (primary) hypertension: Secondary | ICD-10-CM

## 2019-08-14 ENCOUNTER — Other Ambulatory Visit: Payer: Self-pay

## 2019-08-14 ENCOUNTER — Telehealth: Payer: Self-pay

## 2019-08-14 ENCOUNTER — Telehealth (INDEPENDENT_AMBULATORY_CARE_PROVIDER_SITE_OTHER): Payer: Self-pay | Admitting: Internal Medicine

## 2019-08-14 VITALS — BP 123/86 | HR 91 | Ht 70.0 in | Wt 150.0 lb

## 2019-08-14 DIAGNOSIS — R55 Syncope and collapse: Secondary | ICD-10-CM

## 2019-08-14 NOTE — Patient Instructions (Signed)

## 2019-08-14 NOTE — Telephone Encounter (Signed)
  Patient Consent for Virtual Visit         DHYAN NOAH has provided verbal consent on 08/14/2019 for a virtual visit (video or telephone).   CONSENT FOR VIRTUAL VISIT FOR:  Nilda Riggs Graziani  By participating in this virtual visit I agree to the following:  I hereby voluntarily request, consent and authorize [facility] and its employed or contracted physicians, Producer, television/film/video, nurse practitioners or other licensed health care professionals (the Practitioner), to provide me with telemedicine health care services (the "Services") as deemed necessary by the treating Practitioner. I acknowledge and consent to receive the Services by the Practitioner via telemedicine. I understand that the telemedicine visit will involve communicating with the Practitioner through live audiovisual communication technology and the disclosure of certain medical information by electronic transmission. I acknowledge that I have been given the opportunity to request an in-person assessment or other available alternative prior to the telemedicine visit and am voluntarily participating in the telemedicine visit.  I understand that I have the right to withhold or withdraw my consent to the use of telemedicine in the course of my care at any time, without affecting my right to future care or treatment, and that the Practitioner or I may terminate the telemedicine visit at any time. I understand that I have the right to inspect all information obtained and/or recorded in the course of the telemedicine visit and may receive copies of available information for a reasonable fee.  I understand that some of the potential risks of receiving the Services via telemedicine include:  Marland Kitchen Delay or interruption in medical evaluation due to technological equipment failure or disruption; . Information transmitted may not be sufficient (e.g. poor resolution of images) to allow for appropriate medical decision making by the Practitioner;  and/or  . In rare instances, security protocols could fail, causing a breach of personal health information.  Furthermore, I acknowledge that it is my responsibility to provide information about my medical history, conditions and care that is complete and accurate to the best of my ability. I acknowledge that Practitioner's advice, recommendations, and/or decision may be based on factors not within their control, such as incomplete or inaccurate data provided by me or distortions of diagnostic images or specimens that may result from electronic transmissions. I understand that the practice of medicine is not an exact science and that Practitioner makes no warranties or guarantees regarding treatment outcomes. I acknowledge that a copy of this consent can be made available to me via my patient portal [facility] MyChart), or I can request a printed copy by calling the office of [facility].    I understand that my insurance will be billed for this visit.   I have read or had this consent read to me. . I understand the contents of this consent, which adequately explains the benefits and risks of the Services being provided via telemedicine.  . I have been provided ample opportunity to ask questions regarding this consent and the Services and have had my questions answered to my satisfaction. . I give my informed consent for the services to be provided through the use of telemedicine in my medical care

## 2019-08-14 NOTE — Progress Notes (Signed)
Electrophysiology TeleHealth Note   Due to national recommendations of social distancing due to COVID 19, an audio/video telehealth visit is felt to be most appropriate for this patient at this time.  See MyChart message from today for the patient's consent to telehealth for [facility].   Date:  08/14/2019   ID:  Hayden Webb, DOB 12/02/1995, MRN [id number]  Location: patient's home  Provider location: [address], [place]  Evaluation Performed: Follow-up visit  PCP:  Shirline Frees, NP  Cardiologist:  [facility] Electrophysiologist:  SK   Chief Complaint: syncope  History of Present Illness:    Hayden Webb is a 24 y.o. male who presents via audio/video conferencing for a telehealth visit today.  Since last being seen in our clinic 2019  for syncope presumed neurally mediated but rather abrupt in onset and offset, also concomitant hypertension  the patient reports no interval syncope/ presyncope.  No palpitations. Ongoing Rx for BP with amlodipine   The patient denies chest pain, shortness of breath, nocturnal dyspnea, orthopnea or peripheral edema.     The patient denies symptoms of fevers, chills, cough, or new SOB worrisome for COVID 19.   Past Medical History:  Diagnosis Date  . Essential hypertension   . Syncope and collapse     No past surgical history on file.  Current Outpatient Medications  Medication Sig Dispense Refill  . amLODipine (NORVASC) 10 MG tablet TAKE 1 TABLET BY MOUTH EVERY DAY 90 tablet 0  . metoprolol succinate (TOPROL-XL) 25 MG 24 hr tablet Take 1 tablet (25 mg total) by mouth daily. Please make overdue appt with Dr. Graciela Husbands before anymore refills. 1st attempt 30 tablet 0   No current facility-administered medications for this visit.    Allergies:   Patient has no known allergies.   Social History:  The patient  reports that he has never smoked. He has never used smokeless tobacco. He reports that he does not drink alcohol and  does not use drugs.   Family History:  The patient's   family history includes Heart attack in his maternal grandfather, maternal grandmother, paternal grandfather, and paternal grandmother.   ROS:  Please see the history of present illness.   All other systems are personally reviewed and negative.    Exam:    Vital Signs:  BP 123/86   Pulse 91   Ht 5\' 10"  (1.778 m)   Wt 150 lb (68 kg)   BMI 21.52 kg/m     Labs/Other Tests and Data Reviewed:    Recent Labs: No results found for requested labs within last 8760 hours.   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  The importance of social distancing was discussed today.  Follow-up: prn     Current medicines are reviewed at length with the patient today.   The patient does not have concerns regarding his medicines.  The following changes were made today:  none  Labs/ tests ordered today include:   No orders of the defined types were placed in this encounter.     Patient Risk:  after full review of this patients clinical status, I feel that they are at moderate risk at this time.  Today, I have spent 8 minutes with the patient with telehealth technology discussing the above.  Signed, Virl Axe, MD  08/14/2019 2:06 PM     [address] [place] [address] [phone] (office) [phone] (fax)

## 2019-08-18 ENCOUNTER — Other Ambulatory Visit: Payer: Self-pay | Admitting: Internal Medicine

## 2020-01-06 ENCOUNTER — Telehealth (INDEPENDENT_AMBULATORY_CARE_PROVIDER_SITE_OTHER): Payer: Self-pay | Admitting: Adult Health

## 2020-01-06 ENCOUNTER — Encounter: Payer: Self-pay | Admitting: Adult Health

## 2020-01-06 DIAGNOSIS — I10 Essential (primary) hypertension: Secondary | ICD-10-CM

## 2020-01-06 MED ORDER — AMLODIPINE BESYLATE 10 MG PO TABS: 10.0000 mg | ORAL_TABLET | Freq: Every day | ORAL | 3 refills | Status: DC

## 2020-01-06 NOTE — Progress Notes (Signed)
° °  Subjective:    Patient ID: Hayden Webb, male    DOB: [birth year], 24 y.o.   MRN: [id number]  HPI  Virtual Visit via Video Note  I connected with Hayden Webb  on 01/06/20 at  5:00 PM EST by a video enabled telemedicine application and verified that I am speaking with the correct person using two identifiers.  Location patient: home Location provider:work or home office Persons participating in the virtual visit: patient, provider  I discussed the limitations of evaluation and management by telemedicine and the availability of in person appointments. The patient expressed understanding and agreed to proceed.   HPI: 24 year old male who is being evaluated today for follow-up regarding hypertension.  He is currently prescribed Norvasc 10 mg and has been well controlled on this in the past.  He has been out of the medication and needs a refill.  June 2021 was seen by cardiology for his annual visit for syncope palpitations.  He reports no issues with syncope or palpitations and was continued on Toprol 25 mg extended release.  Today he reports no issues, just needs a refill of his Norvasc   ROS: See pertinent positives and negatives per HPI.  Past Medical History:  Diagnosis Date   Essential hypertension    Syncope and collapse     History reviewed. No pertinent surgical history.  Family History  Problem Relation Age of Onset   Heart attack Maternal Grandmother    Heart attack Maternal Grandfather    Heart attack Paternal Grandmother    Heart attack Paternal Grandfather        Current Outpatient Medications:    amLODipine (NORVASC) 10 MG tablet, Take 1 tablet (10 mg total) by mouth daily., Disp: 90 tablet, Rfl: 3   metoprolol succinate (TOPROL-XL) 25 MG 24 hr tablet, take 1 tablet by mouth daily, please make appointment for future refills., Disp: 90 tablet, Rfl: 3  EXAM:  VITALS per patient if applicable:  GENERAL: alert, oriented, appears well and in no  acute distress  HEENT: atraumatic, conjunttiva clear, no obvious abnormalities on inspection of external nose and ears  NECK: normal movements of the head and neck  LUNGS: on inspection no signs of respiratory distress, breathing rate appears normal, no obvious gross SOB, gasping or wheezing  CV: no obvious cyanosis  MS: moves all visible extremities without noticeable abnormality  PSYCH/NEURO: pleasant and cooperative, no obvious depression or anxiety, speech and thought processing grossly intact  ASSESSMENT AND PLAN:  Discussed the following assessment and plan:  Hypertension, unspecified type - Plan: amLODipine (NORVASC) 10 MG tablet, Basic Metabolic Panel     I discussed the assessment and treatment plan with the patient. The patient was provided an opportunity to ask questions and all were answered. The patient agreed with the plan and demonstrated an understanding of the instructions.   The patient was advised to call back or seek an in-person evaluation if the symptoms worsen or if the condition fails to improve as anticipated.   Shirline Frees, NP    Review of Systems     Objective:   Physical Exam        Assessment & Plan:

## 2020-01-12 ENCOUNTER — Other Ambulatory Visit (INDEPENDENT_AMBULATORY_CARE_PROVIDER_SITE_OTHER): Payer: 59

## 2020-01-12 ENCOUNTER — Other Ambulatory Visit: Payer: Self-pay

## 2020-01-12 DIAGNOSIS — I10 Essential (primary) hypertension: Secondary | ICD-10-CM | POA: Diagnosis not present

## 2020-01-12 NOTE — Addendum Note (Signed)
Addended by: Reather Laurence on: 01/12/2020 10:24 AM   Modules accepted: Orders

## 2020-01-13 LAB — BASIC METABOLIC PANEL
BUN: 14 mg/dL (ref 7–25)
CO2: 26 mmol/L (ref 20–32)
Calcium: 10.1 mg/dL (ref 8.6–10.3)
Chloride: 105 mmol/L (ref 98–110)
Creat: 1.15 mg/dL (ref 0.60–1.35)
Glucose, Bld: 104 mg/dL — ABNORMAL HIGH (ref 65–99)
Potassium: 4.1 mmol/L (ref 3.5–5.3)
Sodium: 142 mmol/L (ref 135–146)

## 2020-07-21 ENCOUNTER — Other Ambulatory Visit: Payer: Self-pay | Admitting: Internal Medicine

## 2020-07-21 ENCOUNTER — Encounter: Payer: 59 | Admitting: Adult Health

## 2020-07-22 ENCOUNTER — Encounter: Payer: Self-pay | Admitting: Adult Health

## 2020-07-22 ENCOUNTER — Ambulatory Visit (INDEPENDENT_AMBULATORY_CARE_PROVIDER_SITE_OTHER): Payer: 59 | Admitting: Adult Health

## 2020-07-22 ENCOUNTER — Other Ambulatory Visit: Payer: Self-pay

## 2020-07-22 VITALS — BP 140/60 | HR 111 | Ht 70.0 in | Wt 158.2 lb

## 2020-07-22 DIAGNOSIS — Z0000 Encounter for general adult medical examination without abnormal findings: Secondary | ICD-10-CM

## 2020-07-22 DIAGNOSIS — I10 Essential (primary) hypertension: Secondary | ICD-10-CM | POA: Diagnosis not present

## 2020-07-22 DIAGNOSIS — R000 Tachycardia, unspecified: Secondary | ICD-10-CM | POA: Diagnosis not present

## 2020-07-22 LAB — CBC WITH DIFFERENTIAL/PLATELET
Basophils Absolute: 0 10*3/uL (ref 0.0–0.1)
Basophils Relative: 0.8 % (ref 0.0–3.0)
Eosinophils Absolute: 0.1 10*3/uL (ref 0.0–0.7)
Eosinophils Relative: 2.7 % (ref 0.0–5.0)
HCT: 39.5 % (ref 39.0–52.0)
Hemoglobin: 12.9 g/dL — ABNORMAL LOW (ref 13.0–17.0)
Lymphocytes Relative: 49.3 % — ABNORMAL HIGH (ref 12.0–46.0)
Lymphs Abs: 1.7 10*3/uL (ref 0.7–4.0)
MCHC: 32.6 g/dL (ref 30.0–36.0)
MCV: 78.1 fl (ref 78.0–100.0)
Monocytes Absolute: 0.3 10*3/uL (ref 0.1–1.0)
Monocytes Relative: 7.9 % (ref 3.0–12.0)
Neutro Abs: 1.4 10*3/uL (ref 1.4–7.7)
Neutrophils Relative %: 39.3 % — ABNORMAL LOW (ref 43.0–77.0)
Platelets: 251 10*3/uL (ref 150.0–400.0)
RBC: 5.06 Mil/uL (ref 4.22–5.81)
RDW: 14.7 % (ref 11.5–15.5)
WBC: 3.5 10*3/uL — ABNORMAL LOW (ref 4.0–10.5)

## 2020-07-22 LAB — TSH: TSH: 5.03 u[IU]/mL — ABNORMAL HIGH (ref 0.35–4.50)

## 2020-07-22 NOTE — Progress Notes (Signed)
Subjective:    Patient ID: Hayden Webb, male    DOB: 1996/02/20, 25 y.o.   MRN: [id number]  HPI Patient presents for yearly preventative medicine examination. He is a pleasant 25 year old male who  has a past medical history of Essential hypertension and Syncope and collapse.  Hypertension -currently prescribed Norvasc 10 mg and metoprolol 25 mg ER daily.  Has been well controlled on this medication in the past.  Denies dizziness, lightheadedness, chest pain, or shortness of breath. He did not take his medications yet this morning   BP Readings from Last 3 Encounters:  07/22/20 140/60  01/06/20 135/90  08/14/19 123/86   Palpitations - Takes Metoprolol 25 mg ER daily. Reports infrequent if any palpitations   All immunizations and health maintenance protocols were reviewed with the patient and needed orders were placed.  Appropriate screening laboratory values were ordered for the patient including screening of hyperlipidemia, renal function and hepatic function.  Medication reconciliation,  past medical history, social history, problem list and allergies were reviewed in detail with the patient  Goals were established with regard to weight loss, exercise, and  diet in compliance with medications  Wt Readings from Last 3 Encounters:  07/22/20 158 lb 3.2 oz (71.8 kg)  08/14/19 150 lb (68 kg)  04/25/18 145 lb (65.8 kg)    Review of Systems  Constitutional: Negative.   HENT: Negative.   Eyes: Negative.   Respiratory: Negative.   Cardiovascular: Negative.   Gastrointestinal: Negative.   Endocrine: Negative.   Genitourinary: Negative.   Musculoskeletal: Negative.   Skin: Negative.   Allergic/Immunologic: Negative.   Neurological: Negative.   Hematological: Negative.   Psychiatric/Behavioral: Negative.   All other systems reviewed and are negative.  Past Medical History:  Diagnosis Date  . Essential hypertension   . Syncope and collapse     Social History    Socioeconomic History  . Marital status: Single    Spouse name: Not on file  . Number of children: Not on file  . Years of education: Not on file  . Highest education level: Not on file  Occupational History  . Not on file  Tobacco Use  . Smoking status: Never Smoker  . Smokeless tobacco: Never Used  Vaping Use  . Vaping Use: Never used  Substance and Sexual Activity  . Alcohol use: No  . Drug use: No  . Sexual activity: Not Currently    Birth control/protection: Abstinence  Other Topics Concern  . 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    Social Determinants of Health   Financial Resource Strain: Not on file  Food Insecurity: Not on file  Transportation Needs: Not on file  Physical Activity: Not on file  Stress: Not on file  Social Connections: Not on file  Intimate Partner Violence: Not on file    No past surgical history on file.  Family History  Problem Relation Age of Onset  . Heart attack Maternal Grandmother   . Heart attack Maternal Grandfather   . Heart attack Paternal Grandmother   . Heart attack Paternal Grandfather     No Known Allergies  Current Outpatient Medications on File Prior to Visit  Medication Sig Dispense Refill  . amLODipine (NORVASC) 10 MG tablet Take 1 tablet (10 mg total) by mouth daily. 90 tablet 3  . metoprolol succinate (TOPROL-XL)  25 MG 24 hr tablet take 1 tablet by mouth daily, please make appointment for future refills. 30 tablet 0   No current facility-administered medications on file prior to visit.    BP 140/60 (BP Location: Left Arm, Patient Position: Sitting, Cuff Size: Normal)   Pulse (!) 111   Ht 5\' 10"  (1.778 m)   Wt 158 lb 3.2 oz (71.8 kg)   SpO2 97%   BMI 22.70 kg/m       Objective:   Physical Exam Vitals and nursing note reviewed.  Constitutional:      General: He is not in acute distress.     Appearance: Normal appearance. He is well-developed and normal weight.  HENT:     Head: Normocephalic and atraumatic.     Right Ear: Tympanic membrane, ear canal and external ear normal. There is no impacted cerumen.     Left Ear: Tympanic membrane, ear canal and external ear normal. There is no impacted cerumen.     Nose: Nose normal. No congestion or rhinorrhea.     Mouth/Throat:     Mouth: Mucous membranes are moist.     Pharynx: Oropharynx is clear. No oropharyngeal exudate or posterior oropharyngeal erythema.  Eyes:     General:        Right eye: No discharge.        Left eye: No discharge.     Extraocular Movements: Extraocular movements intact.     Conjunctiva/sclera: Conjunctivae normal.     Pupils: Pupils are equal, round, and reactive to light.  Neck:     Vascular: No carotid bruit.     Trachea: No tracheal deviation.  Cardiovascular:     Rate and Rhythm: Regular rhythm. Tachycardia present.     Pulses: Normal pulses.     Heart sounds: Normal heart sounds. No murmur heard. No friction rub. No gallop.   Pulmonary:     Effort: Pulmonary effort is normal. No respiratory distress.     Breath sounds: Normal breath sounds. No stridor. No wheezing, rhonchi or rales.  Chest:     Chest wall: No tenderness.  Abdominal:     General: Bowel sounds are normal. There is no distension.     Palpations: Abdomen is soft. There is no mass.     Tenderness: There is no abdominal tenderness. There is no right CVA tenderness, left CVA tenderness, guarding or rebound.     Hernia: No hernia is present.  Musculoskeletal:        General: No swelling, tenderness, deformity or signs of injury. Normal range of motion.     Right lower leg: No edema.     Left lower leg: No edema.  Lymphadenopathy:     Cervical: No cervical adenopathy.  Skin:    General: Skin is warm and dry.     Capillary Refill: Capillary refill takes less than 2 seconds.     Coloration: Skin is not jaundiced or pale.      Findings: No bruising, erythema, lesion or rash.  Neurological:     General: No focal deficit present.     Mental Status: He is alert and oriented to person, place, and time.     Cranial Nerves: No cranial nerve deficit.     Sensory: No sensory deficit.     Motor: No weakness.     Coordination: Coordination normal.     Gait: Gait normal.     Deep Tendon Reflexes: Reflexes normal.  Psychiatric:  Mood and Affect: Mood normal.        Behavior: Behavior normal.        Thought Content: Thought content normal.        Judgment: Judgment normal.       Assessment & Plan:  1. Routine general medical examination at a health care facility - Benign exam  - Healthy 25 year old male - CBC with Differential/Platelet; Future - Comprehensive metabolic panel; Future - Lipid panel; Future - TSH; Future  2. Hypertension, unspecified type - No change in medications  - CBC with Differential/Platelet; Future - Comprehensive metabolic panel; Future - Lipid panel; Future - TSH; Future  3. Inappropriate sinus tachycardia - No change in medications.  - Follow up with Cardiology as directed - CBC with Differential/Platelet; Future - Comprehensive metabolic panel; Future - Lipid panel; Future - TSH; Future  Shirline Frees, NP

## 2020-07-22 NOTE — Addendum Note (Signed)
Addended by: Kandra Nicolas on: 07/22/2020 08:45 AM   Modules accepted: Orders

## 2020-07-22 NOTE — Patient Instructions (Signed)
It was great seeing you today   We will follow up with you regarding your blood work   Continue to eat healthy and exercise   I will see you back in one year or sooner if needed 

## 2020-07-23 LAB — LIPID PANEL
Cholesterol: 168 mg/dL (ref 0–200)
HDL: 63.3 mg/dL (ref >39.00)
LDL Cholesterol: 91 mg/dL (ref 0–99)
NonHDL: 104.33
Total CHOL/HDL Ratio: 3
Triglycerides: 66 mg/dL (ref 0.0–149.0)
VLDL: 13.2 mg/dL (ref 0.0–40.0)

## 2020-07-23 LAB — COMPREHENSIVE METABOLIC PANEL
ALT: 20 U/L (ref 0–53)
AST: 14 U/L (ref 0–37)
Albumin: 4.8 g/dL (ref 3.5–5.2)
Alkaline Phosphatase: 63 U/L (ref 39–117)
BUN: 23 mg/dL (ref 6–23)
CO2: 25 mEq/L (ref 19–32)
Calcium: 9.9 mg/dL (ref 8.4–10.5)
Chloride: 101 mEq/L (ref 96–112)
Creatinine, Ser: 1.28 mg/dL (ref 0.40–1.50)
GFR: 78.57 mL/min (ref >60.00)
Glucose, Bld: 98 mg/dL (ref 70–99)
Potassium: 4 mEq/L (ref 3.5–5.1)
Sodium: 140 mEq/L (ref 135–145)
Total Bilirubin: 0.3 mg/dL (ref 0.2–1.2)
Total Protein: 7.8 g/dL (ref 6.0–8.3)

## 2020-07-27 ENCOUNTER — Other Ambulatory Visit: Payer: Self-pay | Admitting: Adult Health

## 2020-07-27 DIAGNOSIS — R7989 Other specified abnormal findings of blood chemistry: Secondary | ICD-10-CM

## 2020-08-02 ENCOUNTER — Telehealth: Payer: Self-pay

## 2020-08-02 NOTE — Progress Notes (Signed)
Letter has been mailed to pt as we are unable to reach him.

## 2020-08-02 NOTE — Telephone Encounter (Signed)
Pt returned call for labs. Pt notified of update and has been scheduled.

## 2020-09-01 ENCOUNTER — Other Ambulatory Visit: Payer: Self-pay

## 2020-09-01 ENCOUNTER — Other Ambulatory Visit (INDEPENDENT_AMBULATORY_CARE_PROVIDER_SITE_OTHER): Payer: 59

## 2020-09-01 DIAGNOSIS — R7989 Other specified abnormal findings of blood chemistry: Secondary | ICD-10-CM | POA: Diagnosis not present

## 2020-09-01 LAB — T4, FREE: Free T4: 0.89 ng/dL (ref 0.60–1.60)

## 2020-09-01 LAB — TSH: TSH: 3.63 u[IU]/mL (ref 0.35–5.50)

## 2020-09-01 LAB — T3, FREE: T3, Free: 3.8 pg/mL (ref 2.3–4.2)

## 2020-09-02 ENCOUNTER — Other Ambulatory Visit: Payer: Self-pay | Admitting: Adult Health

## 2020-09-02 DIAGNOSIS — I10 Essential (primary) hypertension: Secondary | ICD-10-CM

## 2020-09-13 ENCOUNTER — Other Ambulatory Visit: Payer: Self-pay | Admitting: Internal Medicine

## 2020-09-14 ENCOUNTER — Telehealth: Payer: Self-pay | Admitting: Internal Medicine

## 2020-09-14 MED ORDER — METOPROLOL SUCCINATE ER 25 MG PO TB24: ORAL_TABLET | ORAL | 1 refills | Status: DC

## 2020-09-14 NOTE — Telephone Encounter (Signed)
*  STAT* If patient is at the pharmacy, call can be transferred to refill team.   1. Which medications need to be refilled? (please list name of each medication and dose if known)   metoprolol succinate (TOPROL-XL) 25 MG 24 hr tablet    2. Which pharmacy/location (including street and city if local pharmacy) is medication to be sent to?  [facility] - [place], [place] - [address]  3. Do they need a 30 day or 90 day supply? Needs enough medication to last until 10/26 appt. Has no refill left.

## 2020-09-14 NOTE — Telephone Encounter (Signed)
Pt is scheduled for an appointment 12/15/20.  Will send in enough to get pt to appt to [facility].

## 2020-11-08 ENCOUNTER — Other Ambulatory Visit: Payer: Self-pay | Admitting: Internal Medicine

## 2020-12-15 ENCOUNTER — Other Ambulatory Visit: Payer: Self-pay

## 2020-12-15 ENCOUNTER — Encounter: Payer: Self-pay | Admitting: Internal Medicine

## 2020-12-15 ENCOUNTER — Ambulatory Visit (INDEPENDENT_AMBULATORY_CARE_PROVIDER_SITE_OTHER): Payer: 59 | Admitting: Internal Medicine

## 2020-12-15 VITALS — BP 134/74 | HR 107 | Ht 70.0 in | Wt 155.0 lb

## 2020-12-15 DIAGNOSIS — R55 Syncope and collapse: Secondary | ICD-10-CM | POA: Diagnosis not present

## 2020-12-15 DIAGNOSIS — R9431 Abnormal electrocardiogram [ECG] [EKG]: Secondary | ICD-10-CM | POA: Diagnosis not present

## 2020-12-15 DIAGNOSIS — I10 Essential (primary) hypertension: Secondary | ICD-10-CM | POA: Diagnosis not present

## 2020-12-15 DIAGNOSIS — R000 Tachycardia, unspecified: Secondary | ICD-10-CM | POA: Diagnosis not present

## 2020-12-15 DIAGNOSIS — I4711 Inappropriate sinus tachycardia, so stated: Secondary | ICD-10-CM

## 2020-12-15 MED ORDER — METOPROLOL SUCCINATE ER 50 MG PO TB24: 50.0000 mg | ORAL_TABLET | Freq: Every day | ORAL | 3 refills | Status: DC

## 2020-12-15 NOTE — Addendum Note (Signed)
Addended by: Alois Cliche on: 12/15/2020 12:26 PM   Modules accepted: Orders

## 2020-12-15 NOTE — Patient Instructions (Addendum)
Medication Instructions:  Your physician has recommended you make the following change in your medication:   ** Begin Metoprolol Succinate 50mg  - 1 tablet by mouth daily  *If you need a refill on your cardiac medications before your next appointment, please call your pharmacy*   Lab Work: 24 hour urine Matanephrines  If you have labs (blood work) drawn today and your tests are completely normal, you will receive your results only by: MyChart Message (if you have MyChart) OR A paper copy in the mail If you have any lab test that is abnormal or we need to change your treatment, we will call you to review the results.   Testing/Procedures: Your physician has requested that you have an echocardiogram. Echocardiography is a painless test that uses sound waves to create images of your heart. It provides your doctor with information about the size and shape of your heart and how well your heart's chambers and valves are working. This procedure takes approximately one hour. There are no restrictions for this procedure.    Follow-Up: At [facility], you and your health needs are our priority.  As part of our continuing mission to provide you with exceptional heart care, we have created designated Provider Care Teams.  These Care Teams include your primary Cardiologist (physician) and Advanced Practice Providers (APPs -  Physician Assistants and Nurse Practitioners) who all work together to provide you with the care you need, when you need it.  We recommend signing up for the patient portal called "MyChart".  Sign up information is provided on this After Visit Summary.  MyChart is used to connect with patients for Virtual Visits (Telemedicine).  Patients are able to view lab/test results, encounter notes, upcoming appointments, etc.  Non-urgent messages can be sent to your provider as well.   To learn more about what you can do with MyChart, go to [facility].    Your next  appointment:   12 months with APP   Other Instructions Refer to Dr [email or web address] for HTN

## 2020-12-15 NOTE — Progress Notes (Signed)
      Patient Care Team: Shirline Frees, NP as PCP - General (Family Medicine) Chrystie Nose, MD as PCP - Cardiology (Cardiology)   HPI  Hayden Webb is a 25 y.o. male seen in follow-up syncope thought to be neurally mediated despite that it occurs without prodrome.  He also has a history of hypertension.  No interval syncope  The patient denies chest pain, shortness of breath, nocturnal dyspnea, orthopnea or peripheral edema.  There have been no palpitations, lightheadedness      Records and Results Reviewed   Past Medical History:  Diagnosis Date   Essential hypertension    Syncope and collapse     No past surgical history on file.  Current Meds  Medication Sig   amLODipine (NORVASC) 10 MG tablet TAKE 1 TABLET BY MOUTH EVERY DAY   metoprolol succinate (TOPROL-XL) 25 MG 24 hr tablet TAKE 1 TABLET BY MOUTH EVERY DAY, please make appointment FOR future refills    No Known Allergies    Review of Systems negative except from HPI and PMH  Physical Exam BP 134/74   Pulse (!) 107   Ht 5\' 10"  (1.778 m)   Wt 155 lb (70.3 kg)   SpO2 98%   BMI 22.24 kg/m  Well developed and nourished in no acute distress HENT normal Neck supple with JVP-  flat  Clear Regular rate and rhythm, no murmurs or gallops Abd-soft with active BS No Clubbing cyanosis edema Skin-warm and dry A & Oriented  Grossly normal sensory and motor function  ECG sinus @ 107 RAE    Assessment and  Plan  Syncope   Tachypalpitations   Hypertension-grade 2  Abnormal electrocardiogram   No interval syncope.  Unaware of his tachycardia.  Last heart rates were also 110 and over the last few years multiple times have been recorded over 90.  With his hypertension lead to exclude catecholamine excess notable urine study for VMA's  We will also refer him to Dr. for consideration of secondary causes of hypertension  Blood pressure is reasonably controlled, heart rate is fast so we  will increase his metoprolol from 25-50  With his blood pressure and ECG suggesting right atrial enlargement we will undertake an echo to look for secondary endorgan effects

## 2020-12-21 LAB — METANEPHRINES, URINE, 24 HOUR
Metaneph Total, Ur: 40 ug/L
Metanephrines, 24H Ur: 100 ug/24 hr (ref 58–276)
Normetanephrine, 24H Ur: 160 ug/24 hr (ref 110–553)
Normetanephrine, Ur: 64 ug/L

## 2020-12-31 ENCOUNTER — Other Ambulatory Visit: Payer: Self-pay

## 2020-12-31 ENCOUNTER — Ambulatory Visit (HOSPITAL_COMMUNITY): Payer: 59 | Attending: Cardiology

## 2020-12-31 DIAGNOSIS — I10 Essential (primary) hypertension: Secondary | ICD-10-CM | POA: Insufficient documentation

## 2020-12-31 DIAGNOSIS — R9431 Abnormal electrocardiogram [ECG] [EKG]: Secondary | ICD-10-CM | POA: Insufficient documentation

## 2020-12-31 LAB — ECHOCARDIOGRAM COMPLETE
Area-P 1/2: 4.83 cm2
S' Lateral: 2.4 cm

## 2021-02-24 ENCOUNTER — Ambulatory Visit (HOSPITAL_BASED_OUTPATIENT_CLINIC_OR_DEPARTMENT_OTHER): Payer: 59 | Admitting: Cardiovascular Disease

## 2021-04-06 ENCOUNTER — Other Ambulatory Visit: Payer: Self-pay

## 2021-04-06 ENCOUNTER — Encounter (HOSPITAL_BASED_OUTPATIENT_CLINIC_OR_DEPARTMENT_OTHER): Payer: Self-pay | Admitting: Cardiovascular Disease

## 2021-04-06 ENCOUNTER — Ambulatory Visit (INDEPENDENT_AMBULATORY_CARE_PROVIDER_SITE_OTHER): Payer: 59 | Admitting: Cardiovascular Disease

## 2021-04-06 VITALS — BP 154/62 | HR 90 | Ht 70.0 in | Wt 156.2 lb

## 2021-04-06 DIAGNOSIS — I10 Essential (primary) hypertension: Secondary | ICD-10-CM

## 2021-04-06 MED ORDER — IRBESARTAN 150 MG PO TABS: 150.0000 mg | ORAL_TABLET | Freq: Every day | ORAL | 2 refills | Status: DC

## 2021-04-06 NOTE — Patient Instructions (Addendum)
Medication Instructions:  Your physician has recommended you make the following change in your medication:   START Irbesartan one 150mg  tablet daily  *WAIT to start until after you have your renin/aldosterone level checked.  *If you need a refill on your cardiac medications before your next appointment, please call your pharmacy*   Lab Work: Your physician recommends that you return for lab work today for renin/aldosterone level.   Your physician recommends that you return for lab work in 1 week after starting Irbesartan for BMP.   Your provider has recommended lab work. Please have this collected at [facility] at [place]. The lab is open 8:00 am - 4:30 pm. Please avoid 12:00p - 1:00p for lunch hour. You do not need an appointment. Please go to [address] [place], [place] [address]. This is in the Primary Care office on the 3rd floor, let them know you are there for blood work and they will direct you to the lab.   If you have labs (blood work) drawn today and your tests are completely normal, you will receive your results only by: [place] (if you have MyChart) OR A paper copy in the mail If you have any lab test that is abnormal or we need to change your treatment, we will call you to review the results.   Testing/Procedures: None ordered today.   Follow-Up: At [facility], you and your health needs are our priority.  As part of our continuing mission to provide you with exceptional heart care, we have created designated Provider Care Teams.  These Care Teams include your primary Cardiologist (physician) and Advanced Practice Providers (APPs -  Physician Assistants and Nurse Practitioners) who all work together to provide you with the care you need, when you need it.  We recommend signing up for the patient portal called "MyChart".  Sign up information is provided on this After Visit Summary.  MyChart is used to connect with patients for Virtual  Visits (Telemedicine).  Patients are able to view lab/test results, encounter notes, upcoming appointments, etc.  Non-urgent messages can be sent to your provider as well.   To learn more about what you can do with MyChart, go to [email or web address].    Your next appointment:   IN  1 month with Coletta Memos, NP

## 2021-04-06 NOTE — Progress Notes (Incomplete)
Advanced Hypertension Clinic Initial Assessment:    Date:  04/06/2021   ID:  JAHI ROZA, DOB [birth year], MRN [id number]  PCP:  Shirline Frees, NP  Cardiologist:  Chrystie Nose, MD  Nephrologist:  Referring MD: Shirline Frees, NP   CC: Hypertension  History of Present Illness:    Hayden Webb is a 26 y.o. male with a hx of hypertension and episodes of syncope here to establish care in the Advanced Hypertension Clinic.   He was seen by Dr Graciela Husbands on 10/22 for syncope. It was thought to be neurally mediated . At that visit, blood pressure was 134/74. He is on amlodipine and metoprolol. He was tachycardic at 107. His heart rates has been consistently elevated. Metoprolol was increased for tachycardia. Echo 12/2020 revealed LVEF 60-65 with normal diastolic function. Urine metanephrines were normal. Thyroid function was normal  He is accompanied by his mother and she is giving most of the history. His mother reports he has not had any recent episodes of syncope in the past 2 years. He was diagnosed with hypertension 2 years ago. His mother was in his 25's  when she was diagnosed with hypertension. His mother reports it has been recently hard to control according and was first prescribed medication by Dr Lily Kocher. His mother sees a nephrologist to manage her blood pressure and her base line readings are in the 150's. He does not check his blood pressure at home. They do not use salt at home and does not eat a lot of takeout. He drinks green tea and water through the day. He uses tylenol to manage his pain when needed. He uses multivitamins and he does not snore. His mother reports his father has a history of tachycardia. He exercises everyday for about 30 minutes and takes his medication consistently.   Previous antihypertensives:  Secondary Causes of Hypertension  Medications/Herbal: OCP, steroids, stimulants, antidepressants, weight loss medication, immune suppressants, NSAIDs,  sympathomimetics, alcohol, caffeine, licorice, ginseng, St. John's wort, chemo  Sleep Apnea Renal artery stenosis Hyperaldosteronism Hyper/hypothyroidism Pheochromocytoma: palpitations, tachycardia, headache, diaphoresis (plasma metanephrines) Cushing's syndrome: Cushingoid facies, central obesity, proximal muscle weakness, and ecchymoses, adrenal incidentaloma (cortisol) Coarctation of the aorta   Past Medical History:  Diagnosis Date   Essential hypertension    Syncope and collapse     No past surgical history on file.  Current Medications: Current Meds  Medication Sig   amLODipine (NORVASC) 10 MG tablet TAKE 1 TABLET BY MOUTH EVERY DAY   metoprolol succinate (TOPROL-XL) 50 MG 24 hr tablet Take 1 tablet (50 mg total) by mouth daily. Take with or immediately following a meal.     Allergies:   Patient has no known allergies.   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    Social Determinants of Health   Financial Resource Strain: Not on file  Food Insecurity: Not on file  Transportation Needs: No Transportation Needs   Lack of Transportation (Medical): No   Lack of Transportation (Non-Medical): No  Physical Activity: Sufficiently Active   Days of Exercise per Week: 5 days   Minutes of Exercise per Session: 30 min  Stress: Not on file  Social Connections: Not on file      Family History: The patient's ***family history includes Heart attack in his maternal grandfather, maternal grandmother, maternal uncle, paternal grandfather, and paternal grandmother; Hypertension in his father, maternal grandfather, and mother; Syncope episode in his mother.  ROS:   Please see the history of present illness.     All other systems reviewed and are negative.  EKGs/Labs/Other Studies Reviewed:     ECHO 12/31/2020:   1. Left ventricular ejection fraction, by estimation, is 60 to 65%. The left ventricle has normal function. The left ventricle has no regional wall motion abnormalities. Left ventricular diastolic parameters were normal.   2. Right ventricular systolic function is normal. The right ventricular size is normal. Tricuspid regurgitation signal is inadequate for assessing PA pressure.   3. The mitral valve is normal in structure. No evidence of mitral valve regurgitation.   4. The aortic valve was not well visualized. Aortic valve regurgitation is not visualized. No aortic stenosis is present.   5. The inferior vena cava is normal in size with greater than 50%  respiratory variability, suggesting right atrial pressure of 3 mmHg.   EKG:  EKG is  ordered today.  04/06/2021: The ekg ordered today demonstrates sinus rhythm rate- 90 bpm  Recent Labs: 07/22/2020: ALT 20; BUN 23; Creatinine, Ser 1.28; Hemoglobin 12.9; Platelets 251.0; Potassium 4.0; Sodium 140 09/01/2020: TSH 3.63   Recent Lipid Panel    Component Value Date/Time   CHOL 168 07/22/2020 0845   TRIG 66.0 07/22/2020 0845   HDL 63.30 07/22/2020 0845   CHOLHDL 3 07/22/2020 0845   VLDL 13.2 07/22/2020 0845   LDLCALC 91 07/22/2020 0845    Physical Exam:   VS:  BP (!) 153/73 (BP Location: Right Arm, Patient Position: Sitting, Cuff Size: Normal)    Pulse 90    Ht 5\' 10"  (1.778 m)    Wt 156 lb 3.2 oz (70.9 kg)    BMI 22.41 kg/m  , BMI Body mass index is 22.41 kg/m. GENERAL:  Well appearing HEENT:  Pupils equal round and reactive, fundi not visualized, oral mucosa unremarkable NECK:  No jugular venous distention, waveform within normal limits, carotid upstroke brisk and symmetric, no bruits, no thyromegaly LYMPHATICS:  No cervical adenopathy LUNGS:  Clear to auscultation bilaterally HEART:  RRR.  PMI not displaced or sustained,S1 and S2 within normal limits, no S3, no S4, no clicks, no rubs, 2/6 systolic murmur ABD:  Flat, positive bowel sounds normal in frequency in pitch, no bruits, no rebound, no guarding, no midline pulsatile mass, no hepatomegaly, no splenomegaly EXT:  2 plus pulses throughout, no edema, no cyanosis no clubbing SKIN:  No rashes no nodules NEURO:  Cranial nerves II through XII grossly intact, motor grossly intact throughout PSYCH:  Cognitively intact, oriented to person place and time   ASSESSMENT/PLAN:    No problem-specific Assessment & Plan notes found for this encounter.   Screening for Secondary Hypertension: { Click here to document screening for secondary causes of HTN  :[id number]} Causes 04/06/2021  Drugs/Herbals Screened     -  Comments limits sodium, no EtOH, no NSAIDs  Sleep Apnea Screened  Thyroid Disease Screened    Relevant Labs/Studies: Basic Labs Latest Ref Rng & Units 07/22/2020 01/12/2020 04/25/2018  Sodium 135 - 145 mEq/L 140 142 139  Potassium 3.5 - 5.1 mEq/L 4.0 4.1 3.8  Creatinine 0.40 - 1.50 mg/dL 4.97 0.26 3.78    Thyroid  Latest Ref Rng & Units 09/01/2020 07/22/2020  TSH 0.35 - 5.50 uIU/mL 3.63 5.03(H)      Disposition:    FU with PharmD/PA in 1 month  FU with MD in 4 months FU with labs in 1 week  Medication Adjustments/Labs and Tests Ordered: Current medicines are reviewed at length with the patient today.  Concerns regarding medicines are outlined above.  No orders of the defined types were placed in this encounter.  No orders of the defined types were placed in this encounter.  I,Zite Okoli,acting as a Neurosurgeon for Chilton Si, MD.,have documented all relevant documentation on the behalf of Chilton Si, MD,as directed by  Chilton Si, MD while in the presence of Chilton Si, MD.   ***   Signed, Mateo Flow  04/06/2021 12:17 PM     [facility]

## 2021-04-06 NOTE — Assessment & Plan Note (Signed)
Check renin/aldo Catecholamines/metanephrines

## 2021-04-13 ENCOUNTER — Ambulatory Visit (INDEPENDENT_AMBULATORY_CARE_PROVIDER_SITE_OTHER): Payer: 59

## 2021-04-13 ENCOUNTER — Other Ambulatory Visit: Payer: Self-pay

## 2021-04-13 DIAGNOSIS — I10 Essential (primary) hypertension: Secondary | ICD-10-CM | POA: Diagnosis not present

## 2021-04-14 LAB — CATECHOLAMINES, FRACTIONATED, PLASMA
Dopamine: <30 pg/mL (ref 0–48)
Epinephrine: 133 pg/mL — ABNORMAL HIGH (ref 0–62)
Norepinephrine: 504 pg/mL (ref 0–874)

## 2021-04-14 LAB — ALDOSTERONE + RENIN ACTIVITY W/ RATIO
ALDOS/RENIN RATIO: 13.1 (ref 0.0–30.0)
ALDOSTERONE: 8.8 ng/dL (ref 0.0–30.0)
Renin: 0.67 ng/mL/hr (ref 0.167–5.380)

## 2021-05-03 NOTE — Progress Notes (Signed)
error 

## 2021-05-08 ENCOUNTER — Encounter (HOSPITAL_BASED_OUTPATIENT_CLINIC_OR_DEPARTMENT_OTHER): Payer: Self-pay | Admitting: Cardiovascular Disease

## 2021-05-08 NOTE — Progress Notes (Addendum)
Office Visit    Patient Name: Hayden Webb Date of Encounter: 05/08/2021  Primary Care Provider:  Shirline Frees, NP Primary Cardiologist:  Chrystie Nose, MD Primary Electrophysiologist: None Chief Complaint    Hayden Webb is a 26 y.o. male presents today for 1 month follow-up  History of Present Illness    Hayden Webb is a 26 y.o. male with PMH of HTN, syncope.  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  Echo completed 5/19 with EF 55-60%, no RWMA, LV function, normal valvular function.  Repeat echo 11/22 with EF 60-65% and normal LV function, no RWMA.  Renal artery ultrasound completed to rule out secondary cause of hypertension. Results with no evidence of left or right renal arterial stenosis. Patient evaluated by Dr. Graciela Husbands on 9/19 with recommendation of loop recorder implant, however patient declined loop recorder at that time. No reported syncopal episodes in last 2 years.  He was referred to the advanced hypertension clinic and was seen by Dr. Duke Salvia on 2/23.  Blood pressure was elevated at 134/74 and heart rate of 107.  Secondary causes of hypertension were discussed and  aldosterone levels, catecholamines/metanephrines were ordered.  He was started on irbesartan 150 mg.  Since last being seen in our clinic [name] reports doing well and has not experienced any palpitations dizziness or presyncopal episodes.  He is accompanied today by his mother. He denies chest pain, palpitations, dyspnea, PND, orthopnea, nausea, vomiting, dizziness, syncope, edema, weight gain, or early satiety.  He has logged his blood pressures and heart rate of the last month.  His documented heart rates have been as low as the mid 40s-70's.  Today on arrival to his appointment his heart rate was 98, however after being spot checked with a pulse oximeter his heart rate was in the low 50's. His blood pressures are well controlled at home however when he first  came to the office today blood pressure was 150/68.  I rechecked his blood pressure at the end of our visit and it was 132/64.  He reports no diet indiscretions with sodium and is maintaining active lifestyle.  He does not snore per his mother and has no daytime somnolence issues.   Past Medical History    Past Medical History:  Diagnosis Date   Essential hypertension    Syncope and collapse    No past surgical history on file.  Allergies  No Known Allergies  Home Medications    Current Outpatient Medications  Medication Sig Dispense Refill   amLODipine (NORVASC) 10 MG tablet TAKE 1 TABLET BY MOUTH EVERY DAY 90 tablet 3   irbesartan (AVAPRO) 150 MG tablet Take 1 tablet (150 mg total) by mouth daily. 30 tablet 2   metoprolol succinate (TOPROL-XL) 50 MG 24 hr tablet Take 1 tablet (50 mg total) by mouth daily. Take with or immediately following a meal. 90 tablet 3   No current facility-administered medications for this visit.     Review of Systems  Please see the history of present illness.     All other systems reviewed and are otherwise negative except as noted above.  Physical Exam    Wt Readings from Last 3 Encounters:  04/06/21 156 lb 3.2 oz (70.9 kg)  12/15/20 155 lb (70.3 kg)  07/22/20 158 lb 3.2 oz (71.8 kg)   OB:SJGGE were no vitals filed for this visit.,There is no height or weight on file to calculate BMI.  GEN: Well nourished, well developed, in no acute distress. Neck: Supple, no JVD, carotid bruits, or masses. Cardiac: S1,S2, RRR, no murmurs, rubs, or gallops. No clubbing, cyanosis, no edema.  Radials/PT 2+ and equal bilaterally.  Respiratory: Respirations regular and unlabored, clear to auscultation bilaterally. MS: no deformity or atrophy. Skin: warm and dry, no rash. Neuro:  Strength and sensation are intact. Psych: Normal affect.  EKG/LABS/Other Studies Reviewed    ECG personally reviewed by me today -none ordered for today.  Risk  Assessment/Calculations:    Lab Results  Component Value Date   WBC 3.5 (L) 07/22/2020   HGB 12.9 (L) 07/22/2020   HCT 39.5 07/22/2020   MCV 78.1 07/22/2020   PLT 251.0 07/22/2020   Lab Results  Component Value Date   CREATININE 1.28 07/22/2020   BUN 23 07/22/2020   NA 140 07/22/2020   K 4.0 07/22/2020   CL 101 07/22/2020   CO2 25 07/22/2020   Lab Results  Component Value Date   ALT 20 07/22/2020   AST 14 07/22/2020   ALKPHOS 63 07/22/2020   BILITOT 0.3 07/22/2020   Lab Results  Component Value Date   CHOL 168 07/22/2020   HDL 63.30 07/22/2020   LDLCALC 91 07/22/2020   TRIG 66.0 07/22/2020   CHOLHDL 3 07/22/2020    No results found for: HGBA1C  Assessment & Plan    1.  Essential hypertension: Blood pressure today was 132/64 -He denies any additional presyncopal episodes since his last appointment.  His blood pressures at home have been well controlled and heart rate has been bradycardic at times.  We will decrease his Toprol-XL to 25 mg, and increase irbesartan to 75 mg qd . Continue amlodipine 10 mg. -We discussed life stressors such as anxiety and depression.  He does endorse some anxiety daily.  I advised that we could refer him to Dr. Bosie Clos he was interested.  Stated that he would think about it and get back to [place].  -Monitor blood pressures and heart rate for the next 3 weeks and upload to MyChart -Check BMET today and in 1 week -Continue healthy diet and hydrate daily especially with ARB regimen.  2.  Syncope: -Patient reports no presyncopal episodes since his last appointment.  He has been bradycardic per his home logs and Toprol XL will be decreased to 25 mg daily. -He was advised to contact the office if he has any issues with syncope or presyncope.  Disposition: Follow-up with APP in 1 month   Medication Adjustments/Labs and Tests Ordered: Current medicines are reviewed at length with the patient today.  Concerns regarding medicines are outlined above.   Tests Ordered: No orders of the defined types were placed in this encounter.  Medication Changes: No orders of the defined types were placed in this encounter.   Signed, Napoleon Form, Leodis Rains, NP 05/08/2021, 1:47 PM [facility]  Notice: This dictation was prepared with Dragon dictation along with smaller phrase technology. Any transcriptional errors that result from this process are unintentional and may not be corrected upon review.   I spent 83 minutes examining this patient, reviewing medications, and using patient centered shared decision making involving her cardiac care.  Prior to her visit I spent greater than 20 minutes reviewing her past medical history,  medications, and prior cardiac tests.

## 2021-05-09 ENCOUNTER — Encounter (HOSPITAL_BASED_OUTPATIENT_CLINIC_OR_DEPARTMENT_OTHER): Payer: Self-pay | Admitting: General Practice

## 2021-05-09 ENCOUNTER — Other Ambulatory Visit: Payer: Self-pay

## 2021-05-09 ENCOUNTER — Encounter (HOSPITAL_BASED_OUTPATIENT_CLINIC_OR_DEPARTMENT_OTHER): Payer: Self-pay | Admitting: Cardiovascular Disease

## 2021-05-09 ENCOUNTER — Ambulatory Visit (INDEPENDENT_AMBULATORY_CARE_PROVIDER_SITE_OTHER): Payer: 59 | Admitting: Nurse Practitioner

## 2021-05-09 VITALS — BP 132/64 | HR 66 | Ht 70.0 in | Wt 158.6 lb

## 2021-05-09 DIAGNOSIS — I10 Essential (primary) hypertension: Secondary | ICD-10-CM

## 2021-05-09 DIAGNOSIS — R55 Syncope and collapse: Secondary | ICD-10-CM | POA: Diagnosis not present

## 2021-05-09 MED ORDER — IRBESARTAN 75 MG PO TABS: 75.0000 mg | ORAL_TABLET | Freq: Every day | ORAL | 3 refills | Status: DC

## 2021-05-09 MED ORDER — METOPROLOL SUCCINATE ER 25 MG PO TB24: 25.0000 mg | ORAL_TABLET | Freq: Every day | ORAL | 3 refills | Status: DC

## 2021-05-09 NOTE — Patient Instructions (Addendum)
Medication Instructions:  ?Your physician has recommended you make the following change in your medication:  ? ?Change: Irbesartan 75mg  daily  ? ?Change: Metoprolol Succinate 25mg  daily  ? ?  ?*If you need a refill on your cardiac medications before your next appointment, please call your pharmacy* ? ? ?Lab Work: ?Your physician recommends that you return for lab work today- BMET  ? ?Please return for Lab work in 1 week (BMET) . You may come to the...  ? ?[facility] (3rd floor) ?[address], [place], [address] [place]  ?Open: 8am-Noon and 1pm-4:30pm  ? ?[facility] [employer] ?[address]  ? ?[id number]- Any location ? ?**no appointments needed** ? ? ?If you have labs (blood work) drawn today and your tests are completely normal, you will receive your results only by: ?MyChart Message (if you have MyChart) OR ?A paper copy in the mail ?If you have any lab test that is abnormal or we need to change your treatment, we will call you to review the results. ? ?Follow-Up: ?At [facility], you and your health needs are our priority.  As part of our continuing mission to provide you with exceptional heart care, we have created designated Provider Care Teams.  These Care Teams include your primary Cardiologist (physician) and Advanced Practice Providers (APPs -  Physician Assistants and Nurse Practitioners) who all work together to provide you with the care you need, when you need it. ? ?We recommend signing up for the patient portal called "MyChart".  Sign up information is provided on this After Visit Summary.  MyChart is used to connect with patients for Virtual Visits (Telemedicine).  Patients are able to view lab/test results, encounter notes, upcoming appointments, etc.  Non-urgent messages can be sent to your provider as well.   ?To learn more about what you can do with MyChart, go to [employer].   ? ?Your next appointment:   ?Follow up in one month  with PharmD ([facility] or [address])  ? ?Follow up in two months with [facility] ([address] office 5/22-5/24) so that patient can be seen by Ronie Spies, NP  ? ?Other Instructions ?Please check and keep a log of your heart rate and blood pressure for the next 2-3 weeks and then send those readings via mychart message for review!  ? ?

## 2021-05-09 NOTE — Telephone Encounter (Signed)
Hey you saw this patient today, here is his blood pressure readings for the last little bit!  ?

## 2021-05-10 LAB — BASIC METABOLIC PANEL
BUN/Creatinine Ratio: 10 (ref 9–20)
BUN: 13 mg/dL (ref 6–20)
CO2: 23 mmol/L (ref 20–29)
Calcium: 10.5 mg/dL — ABNORMAL HIGH (ref 8.7–10.2)
Chloride: 97 mmol/L (ref 96–106)
Creatinine, Ser: 1.3 mg/dL — ABNORMAL HIGH (ref 0.76–1.27)
Glucose: 96 mg/dL (ref 70–99)
Potassium: 4.5 mmol/L (ref 3.5–5.2)
Sodium: 138 mmol/L (ref 134–144)
eGFR: 79 mL/min/{1.73_m2} (ref >59)

## 2021-05-11 ENCOUNTER — Telehealth (HOSPITAL_BASED_OUTPATIENT_CLINIC_OR_DEPARTMENT_OTHER): Payer: Self-pay

## 2021-05-11 NOTE — Telephone Encounter (Addendum)
Left message for patient to call back ? ? ? ?----- Message from Elizabeth Palau, NP sent at 05/10/2021  7:52 AM EDT ----- ?Mr. Ilg has slightly elevated creatinine and calcium levels. Please advise him to drink plenty of water and stay hydrated. Potassium is normal. We will recheck at May OV. ? ?Thanks ?Robin Searing, NP  ?

## 2021-05-12 ENCOUNTER — Telehealth (HOSPITAL_BASED_OUTPATIENT_CLINIC_OR_DEPARTMENT_OTHER): Payer: Self-pay

## 2021-05-12 NOTE — Telephone Encounter (Addendum)
Left message for patient to call back ? ? ? ?----- Message from Ernest H Dick, NP sent at 05/10/2021  7:52 AM EDT ----- ?Hayden Webb has slightly elevated creatinine and calcium levels. Please advise him to drink plenty of water and stay hydrated. Potassium is normal. We will recheck at May OV. ? ?Thanks ?Ernest Dick, NP  ?

## 2021-05-13 ENCOUNTER — Telehealth (HOSPITAL_BASED_OUTPATIENT_CLINIC_OR_DEPARTMENT_OTHER): Payer: Self-pay

## 2021-05-13 NOTE — Telephone Encounter (Addendum)
3rd call attempt, no answer, LM2CB. Results mailed to patient  ? ? ?----- Message from Elizabeth Palau, NP sent at 05/10/2021  7:52 AM EDT ----- ?Mr. Cocke has slightly elevated creatinine and calcium levels. Please advise him to drink plenty of water and stay hydrated. Potassium is normal. We will recheck at May OV. ? ?Thanks ?Robin Searing, NP  ?

## 2021-05-18 ENCOUNTER — Telehealth (HOSPITAL_BASED_OUTPATIENT_CLINIC_OR_DEPARTMENT_OTHER): Payer: Self-pay

## 2021-05-18 LAB — BASIC METABOLIC PANEL
BUN/Creatinine Ratio: 10 (ref 9–20)
BUN: 13 mg/dL (ref 6–20)
CO2: 24 mmol/L (ref 20–29)
Calcium: 10.1 mg/dL (ref 8.7–10.2)
Chloride: 99 mmol/L (ref 96–106)
Creatinine, Ser: 1.3 mg/dL — ABNORMAL HIGH (ref 0.76–1.27)
Glucose: 89 mg/dL (ref 70–99)
Potassium: 4.1 mmol/L (ref 3.5–5.2)
Sodium: 138 mmol/L (ref 134–144)
eGFR: 79 mL/min/{1.73_m2} (ref >59)

## 2021-05-18 NOTE — Telephone Encounter (Addendum)
Called results to patient and left results on VM (ok per DPR), instructions left to call office back if patient has any questions!  ? ? ?----- Message from [place] Islam., NP sent at 05/18/2021  7:26 AM EDT ----- ?Electrolytes are normal but creatinine remains slightly elevated but stable, Remember to stay hydrated and we will recheck again in May. ? ?Thanks, ? ?Robin Searing, NP ?

## 2021-06-07 ENCOUNTER — Telehealth (HOSPITAL_BASED_OUTPATIENT_CLINIC_OR_DEPARTMENT_OTHER): Payer: Self-pay | Admitting: *Deleted

## 2021-06-07 DIAGNOSIS — R825 Elevated urine levels of drugs, medicaments and biological substances: Secondary | ICD-10-CM

## 2021-06-07 DIAGNOSIS — I10 Essential (primary) hypertension: Secondary | ICD-10-CM

## 2021-06-07 NOTE — Telephone Encounter (Signed)
-----   Message from Chilton Si, MD sent at 06/03/2021  9:42 AM EDT ----- ?Catecholamine levels were high.  Sometimes this means that there is a benign tumor that is overproducing hormones that can raise your BP.  Recommend getting a non-contrasted CT of the abdomen (adrenal protocol) to make sure this isn't the case. ?

## 2021-06-07 NOTE — Telephone Encounter (Signed)
Bp log

## 2021-06-07 NOTE — Telephone Encounter (Signed)
Released in my chart with Dr Leonides Sake comments attached and to call to discuss further  ?Mailbox full  ?

## 2021-06-22 ENCOUNTER — Encounter (HOSPITAL_BASED_OUTPATIENT_CLINIC_OR_DEPARTMENT_OTHER): Payer: Self-pay | Admitting: Pharmacist Clinician (PhC)/ Clinical Pharmacy Specialist

## 2021-06-22 ENCOUNTER — Ambulatory Visit (HOSPITAL_BASED_OUTPATIENT_CLINIC_OR_DEPARTMENT_OTHER): Payer: 59 | Admitting: Pharmacist Clinician (PhC)/ Clinical Pharmacy Specialist

## 2021-06-22 DIAGNOSIS — I159 Secondary hypertension, unspecified: Secondary | ICD-10-CM | POA: Diagnosis not present

## 2021-06-22 NOTE — Progress Notes (Signed)
? ? ? ?  06/22/2021 ?Nilda Riggs Carsey ?15-Apr-1995 ?[id number] ? ? ?HPI:  Hayden Webb is a 26 y.o. male patient of Dr Duke Salvia who presents today for advanced hypertension clinic evaluation.  His medical history is otherwise significant only for inappropriate sinus tachycardia and a syncopal (vasovagal) episode.  He saw Dr. Duke Salvia in February, at which time his presure was 154/62.  A workup for secondary hypertension was done - see below.  Patient reports first being diagnosed around 2 years ago, and notes that mother also has problems with hypertension.   ? ?Today he returns for follow up with his sister.  Recent labs showed an elevation in epinephrine levels and it was recommended he get a non-contrast CT of the abdomen.  He has not made this appointment and I will check to see fi referral is still open.  States no problems with medication compliance or side effects ?  ? ?Blood Pressure Goal:  130/80 ? ?Current Medications: irbesartan 75 mg qd, metoprolol succ 25 mg qd, amlodipine 10 mg qd - all in the am ? ?Family Hx: mom with hypertension - on medication, still elevated; father with htn, better controlled, older sister has htn 93 ? ?Social Hx: no tobacco, no alcohol, no regular caffeine, prefers decaf tea or water ? ?Diet: mostly home cooked, eats out once or twice weekly; no salt added; mix of proteins, veggies mostly frozen ? ?Exercise: crunches, push/sit up, lunges squats, most days - in am ? ?Home BP readings:  ? AM 31 readings average 122/73, HR 67 ? PM 32 readings average 123/67, HR 69 ? ?Intolerances: nkda ? ?Labs:  05/17/21:  Na 138, K 4.1, Glu 89, BUN 13, SCr 1.30, GFR 79 ? ? ?Wt Readings from Last 3 Encounters:  ?06/22/21 151 lb 12.8 oz (68.9 kg)  ?05/09/21 158 lb 9.6 oz (71.9 kg)  ?04/06/21 156 lb 3.2 oz (70.9 kg)  ? ?BP Readings from Last 3 Encounters:  ?06/22/21 138/85  ?05/09/21 132/64  ?04/06/21 (!) 154/62  ? ?Pulse Readings from Last 3 Encounters:  ?06/22/21 (!) 116  ?05/09/21 66  ?04/06/21 90   ? ? ?Current Outpatient Medications  ?Medication Sig Dispense Refill  ? Multiple Vitamins-Minerals (MENS MULTI VITAMIN & MINERAL PO) Take 1 tablet by mouth daily.    ? amLODipine (NORVASC) 10 MG tablet TAKE 1 TABLET BY MOUTH EVERY DAY 90 tablet 3  ? irbesartan (AVAPRO) 75 MG tablet Take 1 tablet (75 mg total) by mouth daily. 90 tablet 3  ? metoprolol succinate (TOPROL-XL) 25 MG 24 hr tablet Take 1 tablet (25 mg total) by mouth daily. Take with or immediately following a meal. 90 tablet 3  ? ?No current facility-administered medications for this visit.  ? ? ?No Known Allergies ? ?Past Medical History:  ?Diagnosis Date  ? Essential hypertension   ? Syncope and collapse   ? ? ?Blood pressure 138/85, pulse (!) 116, height 5\' 10"  (1.778 m), weight 151 lb 12.8 oz (68.9 kg). ? ?Hypertension ?Patient with essential hypertension, now controlled on 3 medications.  Patient was advised to continue with home monitoring 3-4 days per week, with no changes to medication at this time.  He is scheduled to see PA later this month and we can follow up with him after that should it be needed.  ? ? ?Ronie Spies PharmD CPP [facility] ?[facility] ?[address] ?[place], [place] ?[phone] ?

## 2021-06-22 NOTE — Assessment & Plan Note (Signed)
Patient with essential hypertension, now controlled on 3 medications.  Patient was advised to continue with home monitoring 3-4 days per week, with no changes to medication at this time.  He is scheduled to see Ronie Spies PA later this month and we can follow up with him after that should it be needed.  ?

## 2021-06-22 NOTE — Patient Instructions (Signed)
Return for a a follow up appointment later this month with Hayden Webb at the [facility]. office ? ?Check your blood pressure at home daily and keep record of the readings. ? ?Take your BP meds as follows: ? Continue with your current medications ? ?Bring all of your meds, your BP cuff and your record of home blood pressures to your next appointment.  Exercise as you?re able, try to walk approximately 30 minutes per day.  Keep salt intake to a minimum, especially watch canned and prepared boxed foods.  Eat more fresh fruits and vegetables and fewer canned items.  Avoid eating in fast food restaurants.  ? ? HOW TO TAKE YOUR BLOOD PRESSURE: ?Rest 5 minutes before taking your blood pressure. ? Don?t smoke or drink caffeinated beverages for at least 30 minutes before. ?Take your blood pressure before (not after) you eat. ?Sit comfortably with your back supported and both feet on the floor (don?t cross your legs). ?Elevate your arm to heart level on a table or a desk. ?Use the proper sized cuff. It should fit smoothly and snugly around your bare upper arm. There should be enough room to slip a fingertip under the cuff. The bottom edge of the cuff should be 1 inch above the crease of the elbow. ?Ideally, take 3 measurements at one sitting and record the average. ? ? ?

## 2021-07-06 ENCOUNTER — Other Ambulatory Visit: Payer: Self-pay | Admitting: Adult Health

## 2021-07-06 DIAGNOSIS — I10 Essential (primary) hypertension: Secondary | ICD-10-CM

## 2021-07-10 NOTE — Progress Notes (Signed)
Office Visit    Patient Name: Hayden Webb Date of Encounter: 07/11/2021  Primary Care Provider:  Shirline Frees, NP Primary Cardiologist:  Chrystie Nose, MD Primary Electrophysiologist: None  Chief Complaint    ALCEE SIPOS is a 26 y.o. male with PMH of HTN,  and syncope.  Past Medical History    Past Medical History:  Diagnosis Date   Essential hypertension    Syncope and collapse    No past surgical history on file.  Allergies  No Known Allergies  History of Present Illness    Hayden Webb has a PMH of  HTN, syncope.  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 Echo completed 5/19 with EF 55-60%, no RWMA, LV function, normal valvular function.  Repeat echo 11/22 with EF 60-65% and normal LV function, no RWMA. Renal artery ultrasound completed to rule out secondary cause of hypertension. Results with no evidence of left or right renal arterial stenosis. Patient was evaluated by Dr. Graciela Husbands on 9/19 with recommendation of loop recorder implant, however patient declined loop recorder at that time. No reported syncopal episodes in last 2 years.   He was referred to advanced hypertension clinic and seen by Dr. Duke Salvia on 2/23. His blood pressure was elevated at 134/74 and heart rate was 107. Secondary causes of hypertension were discussed and aldosterone levels, catecholamines/metanephrines were ordered. He was started on irbesartan 150 mg. He was seen in the office on 05/09/2021 with blood pressures well controlled with bradycardic heart rate. His irbesartan was decreased to 75 mg daily and Toprol XL to 25 mg. He was seen in the hypertension clinic on 06/22/21 for evaluation. Prior to clinic appointment catecholamines level showed elevation and noncontrast CT of abdomen was recommended but not completed at that time. Blood pressure at HTN clinic were well controlled and no medication changes were made at that time.  Since last being seen in  the office on 04/2021 Mr. Grobe reports that he is doing well with and tolerating his current medications without complaints.  His blood pressures at home are consistently running in the 1 teens to 130s systolically and his heart rate is in the 60s to 70s.  Today in office his blood pressure was 140/82 initially and on recheck was 128/72.  We discussed the need to have CT with noncontrast per Dr. Duke Salvia and he will stop by our scheduling and have that done before leaving today. Patient denies chest pain, palpitations, dyspnea, PND, orthopnea, nausea, vomiting, dizziness, syncope, edema, weight gain, or early satiety.  Home Medications    Current Outpatient Medications  Medication Sig Dispense Refill   amLODipine (NORVASC) 10 MG tablet TAKE 1 TABLET BY MOUTH EVERY DAY 90 tablet 3   irbesartan (AVAPRO) 75 MG tablet Take 1 tablet (75 mg total) by mouth daily. 90 tablet 3   metoprolol succinate (TOPROL-XL) 25 MG 24 hr tablet Take 1 tablet (25 mg total) by mouth daily. Take with or immediately following a meal. 90 tablet 3   Multiple Vitamins-Minerals (MENS MULTI VITAMIN & MINERAL PO) Take 2 tablets by mouth daily.     No current facility-administered medications for this visit.     Review of Systems  Please see the history of present illness.     All other systems reviewed and are otherwise negative except as noted above.  Physical Exam    Wt Readings from Last 3 Encounters:  07/11/21 154 lb 9.6 oz (70.1 kg)  06/22/21 151 lb 12.8 oz (68.9 kg)  05/09/21 158 lb 9.6 oz (71.9 kg)   VS: Vitals:   07/11/21 1010 07/11/21 1035  BP: 140/82 128/72  Pulse: (!) 118   SpO2: 98%   ,Body mass index is 22.18 kg/m.  Constitutional:      Appearance: Healthy appearance. Not in distress.  Neck:     Vascular: JVD normal.  Pulmonary:     Effort: Pulmonary effort is normal.     Breath sounds: No wheezing. No rales. Diminished in the bases Cardiovascular:     Normal rate. Regular rhythm. Normal S1.  Normal S2.      Murmurs: There is no murmur.  Edema:    Peripheral edema absent.  Abdominal:     Palpations: Abdomen is soft non tender. There is no hepatomegaly.  Skin:    General: Skin is warm and dry.  Neurological:     General: No focal deficit present.     Mental Status: Alert and oriented to person, place and time.     Cranial Nerves: Cranial nerves are intact.  EKG/LABS/Other Studies Reviewed    ECG personally reviewed by me today -none completed today  Lab Results  Component Value Date   WBC 3.5 (L) 07/22/2020   HGB 12.9 (L) 07/22/2020   HCT 39.5 07/22/2020   MCV 78.1 07/22/2020   PLT 251.0 07/22/2020   Lab Results  Component Value Date   CREATININE 1.30 (H) 05/17/2021   BUN 13 05/17/2021   NA 138 05/17/2021   K 4.1 05/17/2021   CL 99 05/17/2021   CO2 24 05/17/2021   Lab Results  Component Value Date   ALT 20 07/22/2020   AST 14 07/22/2020   ALKPHOS 63 07/22/2020   BILITOT 0.3 07/22/2020   Lab Results  Component Value Date   CHOL 168 07/22/2020   HDL 63.30 07/22/2020   LDLCALC 91 07/22/2020   TRIG 66.0 07/22/2020   CHOLHDL 3 07/22/2020    No results found for: HGBA1C  Assessment & Plan    1.  Secondary hypertension: -Blood pressure today was well controlled at 128/72 -Continue current antihypertensive regimen with irbesartan 75 mg daily, metoprolol succinate 25 mg daily and amlodipine 10 mg daily -Patient's heart rate today in office was 118.  Heart rate at home is in the 50s and 70s per BP log -May consider 3-day ZIO monitor in future if heart rates remain elevated -Continue to log blood pressures  2.  High catecholamines: -Patient's catecholamine levels were elevated with epinephrine of 133. -Noncontrast CT of abdomen ordered per Dr. Duke Salvia.  -Discussed reason for CT scan and questions encouraged and answered to patient's satisfaction.  3.Syncope: -Patient denies any dizziness or syncopal complaints -Continue metoprolol 25 mg  daily  Disposition: Follow-up with Dr. Duke Salvia or APP in 3 months    Medication Adjustments/Labs and Tests Ordered: Current medicines are reviewed at length with the patient today.  Concerns regarding medicines are outlined above.   Signed, Napoleon Form, Leodis Rains, NP 07/11/2021, 10:35 AM [facility]

## 2021-07-11 ENCOUNTER — Encounter: Payer: Self-pay | Admitting: Nurse Practitioner

## 2021-07-11 ENCOUNTER — Ambulatory Visit: Payer: 59 | Admitting: Nurse Practitioner

## 2021-07-11 VITALS — BP 128/72 | HR 118 | Ht 70.0 in | Wt 154.6 lb

## 2021-07-11 DIAGNOSIS — I10 Essential (primary) hypertension: Secondary | ICD-10-CM

## 2021-07-11 DIAGNOSIS — R55 Syncope and collapse: Secondary | ICD-10-CM | POA: Diagnosis not present

## 2021-07-11 DIAGNOSIS — I159 Secondary hypertension, unspecified: Secondary | ICD-10-CM

## 2021-07-11 DIAGNOSIS — R825 Elevated urine levels of drugs, medicaments and biological substances: Secondary | ICD-10-CM

## 2021-07-11 LAB — BASIC METABOLIC PANEL
BUN/Creatinine Ratio: 14 (ref 9–20)
BUN: 16 mg/dL (ref 6–20)
CO2: 24 mmol/L (ref 20–29)
Calcium: 10 mg/dL (ref 8.7–10.2)
Chloride: 100 mmol/L (ref 96–106)
Creatinine, Ser: 1.13 mg/dL (ref 0.76–1.27)
Glucose: 105 mg/dL — ABNORMAL HIGH (ref 70–99)
Potassium: 4.2 mmol/L (ref 3.5–5.2)
Sodium: 137 mmol/L (ref 134–144)
eGFR: 93 mL/min/{1.73_m2} (ref >59)

## 2021-07-11 NOTE — Patient Instructions (Signed)
Medication Instructions:  Your physician recommends that you continue on your current medications as directed. Please refer to the Current Medication list given to you today.  *If you need a refill on your cardiac medications before your next appointment, please call your pharmacy*   Lab Work: TODAY:  BMET  If you have labs (blood work) drawn today and your tests are completely normal, you will receive your results only by: MyChart Message (if you have MyChart) OR A paper copy in the mail If you have any lab test that is abnormal or we need to change your treatment, we will call you to review the results.   Testing/Procedures: Dr. Duke Salvia recommends you have a CT of the Abdomen.  Non-Cardiac CT scanning, (CAT scanning), is a noninvasive, special x-ray that produces cross-sectional images of the body using x-rays and a computer. CT scans help physicians diagnose and treat medical conditions. For some CT exams, a contrast material is used to enhance visibility in the area of the body being studied. CT scans provide greater clarity and reveal more details than regular x-ray exams.    Follow-Up: At [facility], you and your health needs are our priority.  As part of our continuing mission to provide you with exceptional heart care, we have created designated Provider Care Teams.  These Care Teams include your primary Cardiologist (physician) and Advanced Practice Providers (APPs -  Physician Assistants and Nurse Practitioners) who all work together to provide you with the care you need, when you need it.  We recommend signing up for the patient portal called "MyChart".  Sign up information is provided on this After Visit Summary.  MyChart is used to connect with patients for Virtual Visits (Telemedicine).  Patients are able to view lab/test results, encounter notes, upcoming appointments, etc.  Non-urgent messages can be sent to your provider as well.   To learn more about what you can do with  MyChart, go to [email or web address].    Your next appointment:   3 month(s)  The format for your next appointment:   In Person  Provider:   Chilton Si, MD    Other Instructions   Important Information About Sugar

## 2021-08-02 ENCOUNTER — Ambulatory Visit (INDEPENDENT_AMBULATORY_CARE_PROVIDER_SITE_OTHER)
Admission: RE | Admit: 2021-08-02 | Discharge: 2021-08-02 | Disposition: A | Discharge status: Home / Self Care | Payer: 59 | Source: Ambulatory Visit | Attending: Cardiovascular Disease | Admitting: Cardiovascular Disease

## 2021-08-02 DIAGNOSIS — R7989 Other specified abnormal findings of blood chemistry: Secondary | ICD-10-CM | POA: Diagnosis not present

## 2021-08-02 DIAGNOSIS — I10 Essential (primary) hypertension: Secondary | ICD-10-CM

## 2021-08-02 DIAGNOSIS — R825 Elevated urine levels of drugs, medicaments and biological substances: Secondary | ICD-10-CM

## 2021-08-02 MED ORDER — IOHEXOL 350 MG/ML SOLN
80.0000 mL | Freq: Once | INTRAVENOUS | Status: AC | PRN
Start: 2021-08-02 — End: 2021-08-02
  Administered 2021-08-02: 80 mL via INTRAVENOUS

## 2021-08-03 ENCOUNTER — Inpatient Hospital Stay: Admission: RE | Admit: 2021-08-03 | Payer: 59 | Source: Ambulatory Visit

## 2021-08-30 NOTE — Telephone Encounter (Signed)
CT completed 08/02/2021

## 2021-10-12 ENCOUNTER — Encounter (HOSPITAL_BASED_OUTPATIENT_CLINIC_OR_DEPARTMENT_OTHER): Payer: Self-pay | Admitting: Cardiovascular Disease

## 2021-10-12 ENCOUNTER — Ambulatory Visit (INDEPENDENT_AMBULATORY_CARE_PROVIDER_SITE_OTHER): Payer: 59 | Admitting: Cardiovascular Disease

## 2021-10-12 DIAGNOSIS — R000 Tachycardia, unspecified: Secondary | ICD-10-CM | POA: Diagnosis not present

## 2021-10-12 NOTE — Patient Instructions (Addendum)
Medication Instructions:  Your physician recommends that you continue on your current medications as directed. Please refer to the Current Medication list given to you today.   Labwork: NONE  Testing/Procedures: NONE  Follow-Up: AS NEEDED   CONTINUE TO MONITOR YOUR BLOOD PRESSURE AT HOME

## 2021-10-12 NOTE — Progress Notes (Signed)
Advanced Hypertension Clinic Follow-up:    Date:  10/12/2021   ID:  Hayden Webb, DOB 10/17/1995, MRN [id number]  PCP:  Shirline Frees, NP  Cardiologist:  Chrystie Nose, MD  Nephrologist:  Referring MD: Shirline Frees, NP   CC: Hypertension  History of Present Illness:    Hayden Webb is a 26 y.o. male with a hx of hypertension and episodes of syncope here for follow-up. He was initially seen in the [facility]. He was seen by Dr Graciela Husbands on 10/22 for syncope. It was thought to be neurally mediated . At that visit, blood pressure was 134/74. He is on amlodipine and metoprolol. He was tachycardic at 107. His heart rates has been consistently elevated. Metoprolol was increased for tachycardia. Echo 12/2020 revealed LVEF 60-65 with normal diastolic function. Urine metanephrines were normal. Thyroid function was normal  He was first diagnosed with hypertension in his early 20's. His mother also had early onset hypertension. Renal artery dopplers 03/2021 were normal. Renin and aldosterone were normal. Epinephrine levels were mildly elevated. He had a CT of the abdomen that had no adrenal adenomas. Irbesartan was added. He followed up with Hayden Fabian, NP and his BP was 132/64. His heart rate in the office was 118 bpm but at home it had been in the 50's-70's so no changes were made.   Today, he is feeling pretty good. He presents a blood pressure log which shows average readings of 110-120's/60-70's, with occasional 130's systolic and occasional 80's diastolic. At home his resting heart rate is usually ranging 60-80 bpm. For exercise he performs stretches, sit-ups, and push-ups every morning for 30 minutes. He feels well with exercise; no anginal symptoms. He denies any palpitations, chest pain, shortness of breath, or peripheral edema. No lightheadedness, headaches, syncope, orthopnea, or PND.   Past Medical History:  Diagnosis Date   Essential hypertension    Syncope and  collapse     History reviewed. No pertinent surgical history.  Current Medications: Current Meds  Medication Sig   amLODipine (NORVASC) 10 MG tablet TAKE 1 TABLET BY MOUTH EVERY DAY   irbesartan (AVAPRO) 75 MG tablet Take 1 tablet (75 mg total) by mouth daily.   metoprolol succinate (TOPROL-XL) 25 MG 24 hr tablet Take 1 tablet (25 mg total) by mouth daily. Take with or immediately following a meal.   Multiple Vitamins-Minerals (MENS MULTI VITAMIN & MINERAL PO) Take 2 tablets by mouth daily.     Allergies:   Patient has no known allergies.   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    Social Determinants of Health   Financial Resource Strain: Not on file  Food Insecurity: Not on file  Transportation Needs: No Transportation Needs (04/06/2021)  PRAPARE - Administrator, Civil Service (Medical): No    Lack of Transportation (Non-Medical): No  Physical Activity: Sufficiently Active (04/06/2021)   Exercise Vital Sign    Days of Exercise per Week: 5 days    Minutes of Exercise per Session: 30 min  Stress: Not on file  Social Connections: Not on file     Family History: The patient's family history includes Heart attack in his maternal grandfather, maternal grandmother, maternal uncle, paternal grandfather, and paternal grandmother; Hypertension in his father, maternal  grandfather, and mother; Syncope episode in his mother.  ROS:   Please see the history of present illness.    All other systems reviewed and are negative.  EKGs/Labs/Other Studies Reviewed:    Bilateral Renal Artery Dopplers  04/13/2021: Summary:  Largest Aortic Diameter: 1.3 cm     Renal:     Right: Normal size right kidney. Normal right Resisitive Index.         Normal cortical thickness of right kidney. No evidence of         right renal artery stenosis. RRV flow present.  Left:  Normal size of left kidney. Normal left Resistive Index.         Normal cortical thickness of the left kidney. No evidence of         left renal artery stenosis. LRV flow present.  Mesenteric:     Limited evaluation of aorta due to overlying bowel. Aortic branches not  seen.   ECHO 12/31/2020:  1. Left ventricular ejection fraction, by estimation, is 60 to 65%. The left ventricle has normal function. The left ventricle has no regional wall motion abnormalities. Left ventricular diastolic parameters were normal.   2. Right ventricular systolic function is normal. The right ventricular size is normal. Tricuspid regurgitation signal is inadequate for assessing PA pressure.   3. The mitral valve is normal in structure. No evidence of mitral valve regurgitation.   4. The aortic valve was not well visualized. Aortic valve regurgitation is not visualized. No aortic stenosis is present.   5. The inferior vena cava is normal in size with greater than 50%  respiratory variability, suggesting right atrial pressure of 3 mmHg.   EKG:  EKG is personally reviewed.   10/12/2021: Sinus tachycardia. Rate 105 bpm. 04/06/2021: sinus rhythm rate- 90 bpm  Recent Labs: 07/11/2021: BUN 16; Creatinine, Ser 1.13; Potassium 4.2; Sodium 137   Recent Lipid Panel    Component Value Date/Time   CHOL 168 07/22/2020 0845   TRIG 66.0 07/22/2020 0845   HDL 63.30 07/22/2020 0845   CHOLHDL 3 07/22/2020 0845   VLDL 13.2 07/22/2020  0845   LDLCALC 91 07/22/2020 0845    Physical Exam:    VS:  BP 132/70 (BP Location: Right Arm, Patient Position: Sitting, Cuff Size: Normal)   Pulse (!) 105   Ht 5\' 10"  (1.778 m)   Wt 151 lb 8 oz (68.7 kg)   BMI 21.74 kg/m  , BMI Body mass index is 21.74 kg/m. GENERAL:  Well appearing HEENT: Pupils equal round and reactive, fundi not visualized, oral mucosa unremarkable NECK:  No jugular venous distention, waveform within normal limits, carotid upstroke brisk and symmetric, no bruits, no thyromegaly LUNGS:  Clear to auscultation bilaterally HEART:  RRR.  PMI not displaced or sustained,S1 and S2 within normal limits, no S3, no S4, no clicks, no rubs, 2/6 systolic murmur ABD:  Flat, positive bowel sounds normal in frequency in pitch, no bruits, no rebound,  no guarding, no midline pulsatile mass, no hepatomegaly, no splenomegaly EXT:  2 plus pulses throughout, no edema, no cyanosis no clubbing SKIN:  No rashes no nodules NEURO:  Cranial nerves II through XII grossly intact, motor grossly intact throughout PSYCH:  Cognitively intact, oriented to person place and time   ASSESSMENT/PLAN:    Inappropriate sinus tachycardia Heart rate is much better controlled on metoprolol.  It was high in the office today but has been very well controlled at home.  He notes that he gets anxious when he comes to the office.  Continue current dose of metoprolol.  Hypertension Secondary causes have been evaluated and none were identified.  Blood pressure is now well controlled on amlodipine, irbesartan, and metoprolol.  He is doing a good job with exercising regularly.  No changes recommended at this time.  Screening for Secondary Hypertension:     04/06/2021   12:12 PM  Causes  Drugs/Herbals Screened     - Comments limits sodium, no EtOH, no NSAIDs  Renovascular HTN Screened     - Comments check renal artery Dopplers  Sleep Apnea Screened  Thyroid Disease Screened  Hyperaldosteronism Screened      - Comments check renin/aldo  Pheochromocytoma Screened     - Comments check catecholamines and metanephrines  Cushing's Syndrome N/A  Hyperparathyroidism Screened  Coarctation of the Aorta Screened     - Comments BP symmetric  Compliance Screened    Relevant Labs/Studies:    Latest Ref Rng & Units 07/11/2021   10:40 AM 05/17/2021    1:16 PM 05/09/2021   12:58 PM  Basic Labs  Sodium 134 - 144 mmol/L 137  138  138   Potassium 3.5 - 5.2 mmol/L 4.2  4.1  4.5   Creatinine 0.76 - 1.27 mg/dL 0.81  4.48  1.85        Latest Ref Rng & Units 09/01/2020    8:03 AM 07/22/2020    8:45 AM  Thyroid   TSH 0.35 - 5.50 uIU/mL 3.63  5.03        Disposition:    FU with Hayden Hackler C. Duke Salvia, MD, [facility] as needed.   Medication Adjustments/Labs and Tests Ordered: Current medicines are reviewed at length with the patient today.  Concerns regarding medicines are outlined above.   Orders Placed This Encounter  Procedures   EKG 12-Lead   No orders of the defined types were placed in this encounter.  Patient Instructions  Medication Instructions:  Your physician recommends that you continue on your current medications as directed. Please refer to the Current Medication list given to you today.   Labwork: NONE  Testing/Procedures: NONE  Follow-Up: AS NEEDED   CONTINUE TO MONITOR YOUR BLOOD PRESSURE AT HOME   I,Hayden Webb,acting as a scribe for Hayden Si, MD.,have documented all relevant documentation on the behalf of Hayden Si, MD,as directed by  Hayden Si, MD while in the presence of Hayden Si, MD.  I, Hayden Alcivar C. Duke Salvia, MD have reviewed all documentation for this visit.  The documentation of the exam, diagnosis, procedures, and orders on 10/12/2021 are all accurate and complete.  Signed, Hayden Si, MD  10/12/2021 11:12 AM    [facility]

## 2021-10-12 NOTE — Assessment & Plan Note (Signed)
Secondary causes have been evaluated and none were identified.  Blood pressure is now well controlled on amlodipine, irbesartan, and metoprolol.  He is doing a good job with exercising regularly.  No changes recommended at this time.

## 2021-10-12 NOTE — Assessment & Plan Note (Signed)
Heart rate is much better controlled on metoprolol.  It was high in the office today but has been very well controlled at home.  He notes that he gets anxious when he comes to the office.  Continue current dose of metoprolol.

## 2022-01-23 ENCOUNTER — Other Ambulatory Visit (HOSPITAL_BASED_OUTPATIENT_CLINIC_OR_DEPARTMENT_OTHER): Payer: Self-pay | Admitting: Nurse Practitioner

## 2022-01-23 DIAGNOSIS — I10 Essential (primary) hypertension: Secondary | ICD-10-CM

## 2022-06-14 ENCOUNTER — Other Ambulatory Visit: Payer: Self-pay | Admitting: Adult Health

## 2022-06-14 DIAGNOSIS — I10 Essential (primary) hypertension: Secondary | ICD-10-CM

## 2022-06-15 ENCOUNTER — Encounter: Payer: Self-pay | Admitting: Adult Health

## 2022-06-15 ENCOUNTER — Ambulatory Visit: Payer: Commercial Managed Care - HMO | Admitting: Adult Health

## 2022-06-15 VITALS — BP 130/62 | HR 103 | Temp 98.5°F | Ht 70.0 in | Wt 152.0 lb

## 2022-06-15 DIAGNOSIS — Z0000 Encounter for general adult medical examination without abnormal findings: Secondary | ICD-10-CM

## 2022-06-15 DIAGNOSIS — Z1159 Encounter for screening for other viral diseases: Secondary | ICD-10-CM

## 2022-06-15 DIAGNOSIS — I10 Essential (primary) hypertension: Secondary | ICD-10-CM

## 2022-06-15 DIAGNOSIS — Z23 Encounter for immunization: Secondary | ICD-10-CM | POA: Diagnosis not present

## 2022-06-15 DIAGNOSIS — I4711 Inappropriate sinus tachycardia, so stated: Secondary | ICD-10-CM | POA: Diagnosis not present

## 2022-06-15 LAB — TSH: TSH: 3.49 u[IU]/mL (ref 0.35–5.50)

## 2022-06-15 LAB — COMPREHENSIVE METABOLIC PANEL
ALT: 16 U/L (ref 0–53)
AST: 14 U/L (ref 0–37)
Albumin: 4.9 g/dL (ref 3.5–5.2)
Alkaline Phosphatase: 54 U/L (ref 39–117)
BUN: 18 mg/dL (ref 6–23)
CO2: 28 mEq/L (ref 19–32)
Calcium: 9.7 mg/dL (ref 8.4–10.5)
Chloride: 103 mEq/L (ref 96–112)
Creatinine, Ser: 1.17 mg/dL (ref 0.40–1.50)
GFR: 86.36 mL/min (ref >60.00)
Glucose, Bld: 106 mg/dL — ABNORMAL HIGH (ref 70–99)
Potassium: 3.8 mEq/L (ref 3.5–5.1)
Sodium: 139 mEq/L (ref 135–145)
Total Bilirubin: 0.3 mg/dL (ref 0.2–1.2)
Total Protein: 8.3 g/dL (ref 6.0–8.3)

## 2022-06-15 LAB — CBC
HCT: 38.7 % — ABNORMAL LOW (ref 39.0–52.0)
Hemoglobin: 12.7 g/dL — ABNORMAL LOW (ref 13.0–17.0)
MCHC: 32.7 g/dL (ref 30.0–36.0)
MCV: 79.1 fl (ref 78.0–100.0)
Platelets: 267 10*3/uL (ref 150.0–400.0)
RBC: 4.9 Mil/uL (ref 4.22–5.81)
RDW: 15.3 % (ref 11.5–15.5)
WBC: 3.5 10*3/uL — ABNORMAL LOW (ref 4.0–10.5)

## 2022-06-15 NOTE — Progress Notes (Signed)
Subjective:    Patient ID: Hayden Webb, male    DOB: 02/12/1996, 27 y.o.   MRN: [id number]  HPI Patient presents for yearly preventative medicine examination. He is a pleasant 27 year old male who  has a past medical history of Essential hypertension and Syncope and collapse.  Hypertension -currently prescribed Norvasc 10 mg and metoprolol 25 mg ER daily.  Has been well controlled on this medication in the past.  Denies dizziness, lightheadedness, chest pain, or shortness of breath. His last renal ultrasound in 05/2017 showed no stenosis.  He was referred to the advanced hypertension clinic in February 2023 for his blood pressure being elevated at 134/75 and heart rate was 107.  Aldosterone levels, catecholamines, metanephrines were ordered.  He was started onirbesartan 150 mg.  On follow-up in 04/2021 his blood pressures were controlled with bradycardic heart rate.  Irbesartan was decreased to 75 mg daily and Toprol XL to 25 mg.  He was seen for follow-up on 06/22/2021 and prior to the appointment catecholamine level showed elevation and noncontrast CT of the abdomen was recommended but was not completed at this time.  CT was done in June 2023 which showed no evidence of adrenal mass or other significant abnormality.  Does monitor his blood pressures at home and are consistently running in the 1 teens to 130s systolic with a heart rate in the 60s to 70s.  BP Readings from Last 3 Encounters:  06/15/22 130/62  10/12/21 132/70  07/11/21 128/72   Inappropriate sinus tachycardia  - Takes Metoprolol 25 mg ER daily. Reports infrequent if any palpitations   All immunizations and health maintenance protocols were reviewed with the patient and needed orders were placed. Due for HPV vaccination   Appropriate screening laboratory values were ordered for the patient including screening of hyperlipidemia, renal function and hepatic function.  Medication reconciliation,  past medical history, social history,  problem list and allergies were reviewed in detail with the patient  Goals were established with regard to weight loss, exercise, and  diet in compliance with medications Wt Readings from Last 3 Encounters:  06/15/22 152 lb (68.9 kg)  10/12/21 151 lb 8 oz (68.7 kg)  07/11/21 154 lb 9.6 oz (70.1 kg)   Review of Systems  Constitutional: Negative.   HENT: Negative.    Eyes: Negative.   Respiratory: Negative.    Cardiovascular: Negative.   Gastrointestinal: Negative.   Endocrine: Negative.   Genitourinary: Negative.   Musculoskeletal: Negative.   Skin: Negative.   Allergic/Immunologic: Negative.   Neurological: Negative.   Hematological: Negative.   Psychiatric/Behavioral: Negative.    All other systems reviewed and are negative.  Past Medical History:  Diagnosis Date   Essential hypertension    Syncope and collapse     Social History   Socioeconomic History   Marital status: Single    Spouse name: Not on file   Number of children: Not on file   Years of education: Not on file   Highest education level: Not on file  Occupational History   Not on file  Tobacco Use   Smoking status: Never   Smokeless tobacco: Never  Vaping Use   Vaping Use: Never used  Substance and Sexual Activity   Alcohol use: No   Drug use: No   Sexual activity: Not Currently    Birth control/protection: Abstinence  Other Topics Concern   Not on file  Social History Narrative   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    Social Determinants of Health   Financial Resource Strain: Not on file  Food Insecurity: Not on file  Transportation Needs: No Transportation Needs (04/06/2021)   PRAPARE - Administrator, Civil Service (Medical): No    Lack of Transportation (Non-Medical): No  Physical Activity: Sufficiently Active (04/06/2021)   Exercise Vital Sign    Days of Exercise per Week: 5 days    Minutes of Exercise per  Session: 30 min  Stress: Not on file  Social Connections: Not on file  Intimate Partner Violence: Not on file    No past surgical history on file.  Family History  Problem Relation Age of Onset   Hypertension Mother    Syncope episode Mother    Hypertension Father    Heart attack Maternal Uncle    Heart attack Maternal Grandmother    Hypertension Maternal Grandfather    Heart attack Maternal Grandfather    Heart attack Paternal Grandmother    Heart attack Paternal Grandfather     No Known Allergies  Current Outpatient Medications on File Prior to Visit  Medication Sig Dispense Refill   amLODipine (NORVASC) 10 MG tablet TAKE 1 TABLET BY MOUTH EVERY DAY 90 tablet 3   irbesartan (AVAPRO) 75 MG tablet Take 1 tablet by mouth daily. 90 tablet 1   metoprolol succinate (TOPROL-XL) 25 MG 24 hr tablet Take 1 tablet (25 mg total) by mouth daily. Take with or immediately following a meal. 90 tablet 1   Multiple Vitamins-Minerals (MENS MULTI VITAMIN & MINERAL PO) Take 2 tablets by mouth daily.     No current facility-administered medications on file prior to visit.    BP 130/62   Pulse (!) 103   Temp 98.5 F (36.9 C) (Oral)   Ht  (1.778 m)   Wt 152 lb (68.9 kg)   SpO2 98%   BMI 21.81 kg/m       Objective:   Physical Exam Vitals and nursing note reviewed.  Constitutional:      General: He is not in acute distress.    Appearance: Normal appearance. He is not ill-appearing.  HENT:     Head: Normocephalic and atraumatic.     Right Ear: Tympanic membrane, ear canal and external ear normal. There is no impacted cerumen.     Left Ear: Tympanic membrane, ear canal and external ear normal. There is no impacted cerumen.     Nose: Nose normal. No congestion or rhinorrhea.     Mouth/Throat:     Mouth: Mucous membranes are moist.     Pharynx: Oropharynx is clear.  Eyes:     Extraocular Movements: Extraocular movements intact.     Conjunctiva/sclera: Conjunctivae normal.      Pupils: Pupils are equal, round, and reactive to light.  Neck:     Vascular: No carotid bruit.  Cardiovascular:     Rate and Rhythm: Normal rate and regular rhythm.     Pulses: Normal pulses.     Heart sounds: No murmur heard.    No friction rub. No gallop.  Pulmonary:     Effort: Pulmonary effort is normal.     Breath sounds: Normal breath sounds.  Abdominal:     General: Abdomen is flat. Bowel sounds are normal. There is no distension.     Palpations: Abdomen is soft. There is no mass.     Tenderness: There is no abdominal tenderness. There  is no guarding or rebound.     Hernia: No hernia is present.  Musculoskeletal:        General: Normal range of motion.     Cervical back: Normal range of motion and neck supple.  Lymphadenopathy:     Cervical: No cervical adenopathy.  Skin:    General: Skin is warm and dry.     Capillary Refill: Capillary refill takes less than 2 seconds.  Neurological:     General: No focal deficit present.     Mental Status: He is alert and oriented to person, place, and time.  Psychiatric:        Mood and Affect: Mood normal.        Behavior: Behavior normal.        Thought Content: Thought content normal.        Judgment: Judgment normal.        Assessment & Plan:  1. Routine general medical examination at a health care facility Today patient counseled on age appropriate routine health concerns for screening and prevention, each reviewed and up to date or declined. Immunizations reviewed and up to date or declined. Labs ordered and reviewed. Risk factors for depression reviewed and negative. Hearing function and visual acuity are intact. ADLs screened and addressed as needed. Functional ability and level of safety reviewed and appropriate. Education, counseling and referrals performed based on assessed risks today. Patient provided with a copy of personalized plan for preventive services. - First HPV vaccination given today - follow up in 6 months for  second vaccination  - Continue to stay active and eat healthy   2. Hypertension, unspecified type - Per Cardiology. Continue current medications  - TSH; Future - CBC; Future - Comprehensive metabolic panel; Future  3. Inappropriate sinus tachycardia - Per cardiology. Continue current medications  - TSH; Future - CBC; Future - Comprehensive metabolic panel; Future  4. Need for hepatitis C screening test  - Hep C Antibody; Future

## 2022-06-15 NOTE — Patient Instructions (Signed)
It was great seeing you today   We will follow up with you regarding your lab work   Please let me know if you need anything   Follow up in 6 months for second HPV vaccination   Follow up in one year for next CPE

## 2022-06-15 NOTE — Addendum Note (Signed)
Addended by: Waymon Amato R on: 06/15/2022 09:46 AM   Modules accepted: Orders

## 2022-06-16 LAB — HEPATITIS C ANTIBODY: Hepatitis C Ab: NONREACTIVE

## 2022-07-11 ENCOUNTER — Other Ambulatory Visit (HOSPITAL_BASED_OUTPATIENT_CLINIC_OR_DEPARTMENT_OTHER): Payer: Self-pay | Admitting: Nurse Practitioner

## 2022-07-11 DIAGNOSIS — I10 Essential (primary) hypertension: Secondary | ICD-10-CM

## 2022-11-01 ENCOUNTER — Other Ambulatory Visit (HOSPITAL_BASED_OUTPATIENT_CLINIC_OR_DEPARTMENT_OTHER): Payer: Self-pay | Admitting: Nurse Practitioner

## 2022-11-01 DIAGNOSIS — I10 Essential (primary) hypertension: Secondary | ICD-10-CM

## 2022-11-17 ENCOUNTER — Ambulatory Visit: Payer: Managed Care, Other (non HMO) | Admitting: Adult Health

## 2022-11-24 ENCOUNTER — Ambulatory Visit (INDEPENDENT_AMBULATORY_CARE_PROVIDER_SITE_OTHER): Payer: Managed Care, Other (non HMO) | Admitting: Adult Health

## 2022-11-24 ENCOUNTER — Encounter: Payer: Self-pay | Admitting: Adult Health

## 2022-11-24 VITALS — BP 112/60 | HR 95 | Temp 98.3°F | Wt 153.0 lb

## 2022-11-24 DIAGNOSIS — Z23 Encounter for immunization: Secondary | ICD-10-CM | POA: Diagnosis not present

## 2022-11-24 DIAGNOSIS — I4711 Inappropriate sinus tachycardia, so stated: Secondary | ICD-10-CM

## 2022-11-24 DIAGNOSIS — I10 Essential (primary) hypertension: Secondary | ICD-10-CM

## 2022-11-24 MED ORDER — IRBESARTAN 75 MG PO TABS: 75.0000 mg | ORAL_TABLET | Freq: Every day | ORAL | 3 refills | Status: DC

## 2022-11-24 MED ORDER — METOPROLOL SUCCINATE ER 25 MG PO TB24
25.0000 mg | ORAL_TABLET | Freq: Every day | ORAL | 3 refills | Status: DC
Start: 2022-11-24 — End: 2023-11-28

## 2022-11-24 NOTE — Progress Notes (Signed)
Subjective:    Patient ID: Hayden Webb, male    DOB: 04-Sep-1995, 27 y.o.   MRN: [id number]  HPI  27 year old male who  has a past medical history of Essential hypertension and Syncope and collapse.  Hypertension -currently prescribed Norvasc 10 mg, irbesartan 75 mg  and metoprolol 25 mg ER daily.  Has been well controlled on this medication in the past.  Denies dizziness, lightheadedness, chest pain, or shortness of breath. His last renal ultrasound in 05/2017 showed no stenosis.  He was referred to the advanced hypertension clinic in February 2023 for his blood pressure being elevated at 134/75 and heart rate was 107.  Aldosterone levels, catecholamines, metanephrines were ordered.    He was seen for follow-up on 06/22/2021 and prior to the appointment catecholamine level showed elevation and noncontrast CT of the abdomen was recommended but was not completed at this time.  CT was done in June 2023 which showed no evidence of adrenal mass or other significant abnormality.  Does monitor his blood pressures at home and are consistently running in the 1 teens to 130s systolic with a heart rate in the 60s to 70s.  BP Readings from Last 3 Encounters:  11/24/22 112/60  06/15/22 130/62  10/12/21 132/70   Inappropriate sinus tachycardia  - Takes Metoprolol 25 mg ER daily. Reports infrequent if any palpitations   He has been seen by cardiology in the past last being in May 2023.  He is wondering if I can take over his medications since he is stable.  Review of Systems See HPI   Past Medical History:  Diagnosis Date   Essential hypertension    Syncope and collapse     Social History   Socioeconomic History   Marital status: Single    Spouse name: Not on file   Number of children: Not on file   Years of education: Not on file   Highest education level: Not on file  Occupational History   Not on file  Tobacco Use   Smoking status: Never   Smokeless tobacco: Never  Vaping Use   Vaping  status: Never Used  Substance and Sexual Activity   Alcohol use: No   Drug use: No   Sexual activity: Not Currently    Birth control/protection: Abstinence  Other Topics Concern   Not on file  Social History Narrative   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    Social Determinants of Health   Financial Resource Strain: Not on file  Food Insecurity: Not on file  Transportation Needs: No Transportation Needs (04/06/2021)   PRAPARE - Administrator, Civil Service (Medical): No    Lack of Transportation (Non-Medical): No  Physical Activity: Sufficiently Active (04/06/2021)   Exercise Vital Sign    Days of Exercise per Week: 5 days    Minutes of Exercise per Session: 30 min  Stress: Not on file  Social Connections: Not on file  Intimate Partner Violence: Not on file    History reviewed. No pertinent surgical history.  Family History  Problem Relation Age of Onset   Hypertension Mother    Syncope episode Mother    Hypertension Father    Heart attack Maternal Uncle    Heart attack Maternal Grandmother    Hypertension Maternal Grandfather    Heart attack Maternal Grandfather    Heart attack Paternal Grandmother  Heart attack Paternal Grandfather     No Known Allergies  Current Outpatient Medications on File Prior to Visit  Medication Sig Dispense Refill   amLODipine (NORVASC) 10 MG tablet TAKE 1 TABLET BY MOUTH EVERY DAY 90 tablet 3   Multiple Vitamins-Minerals (MENS MULTI VITAMIN & MINERAL PO) Take 2 tablets by mouth daily.     No current facility-administered medications on file prior to visit.    BP 112/60   Pulse 95   Temp 98.3 F (36.8 C)   Wt 153 lb (69.4 kg)   SpO2 97%   BMI 21.95 kg/m       Objective:   Physical Exam Vitals and nursing note reviewed.  Constitutional:      Appearance: Normal appearance. He is obese.  Cardiovascular:     Rate and Rhythm: Normal rate and regular  rhythm.     Pulses: Normal pulses.     Heart sounds: Normal heart sounds.  Pulmonary:     Effort: Pulmonary effort is normal.     Breath sounds: Normal breath sounds.  Skin:    General: Skin is warm and dry.  Neurological:     General: No focal deficit present.     Mental Status: He is alert and oriented to person, place, and time.  Psychiatric:        Mood and Affect: Mood normal.        Behavior: Behavior normal.        Thought Content: Thought content normal.        Judgment: Judgment normal.        Assessment & Plan:   1. Hypertension, unspecified type -I am fine with taking over his medication at this point in time.  Will send in medications that he needs refilled on.  Will see him back in April for his CPE. - irbesartan (AVAPRO) 75 MG tablet; Take 1 tablet (75 mg total) by mouth daily.  Dispense: 90 tablet; Refill: 3  2. Inappropriate sinus tachycardia (HCC)  - metoprolol succinate (TOPROL-XL) 25 MG 24 hr tablet; Take 1 tablet (25 mg total) by mouth daily.  Dispense: 90 tablet; Refill: 3   Shirline Frees, NP

## 2022-11-24 NOTE — Addendum Note (Signed)
Addended by: Waymon Amato R on: 11/24/2022 12:00 PM   Modules accepted: Orders

## 2023-06-25 ENCOUNTER — Other Ambulatory Visit: Payer: Self-pay | Admitting: Adult Health

## 2023-06-25 DIAGNOSIS — I10 Essential (primary) hypertension: Secondary | ICD-10-CM

## 2023-08-22 ENCOUNTER — Other Ambulatory Visit: Payer: Self-pay | Admitting: Adult Health

## 2023-08-22 DIAGNOSIS — I4711 Inappropriate sinus tachycardia, so stated: Secondary | ICD-10-CM

## 2023-08-22 DIAGNOSIS — I10 Essential (primary) hypertension: Secondary | ICD-10-CM

## 2023-08-25 IMAGING — CT CT ABDOMEN WO/W CM
3 of 10 series · 12 of 46 positions shown, 18 images · IV contrast (OMNIPAQUE 350)
Comparison: None Available.

CLINICAL DATA: Hypertension. Elevated serum catecholamines.
Evaluate for adrenal mass.

EXAM:
CT ABDOMEN WITHOUT AND WITH CONTRAST
TECHNIQUE: Multidetector CT imaging of the abdomen was performed following the
standard protocol before and following the bolus administration of
intravenous contrast.

[Series 4: arterial 3.0 br40 2 · axial · arterial · 0.60mm/px · z∈[-266,-194]mm · 4 of 71 slices shown]
[im 6/71  soft-tissue]
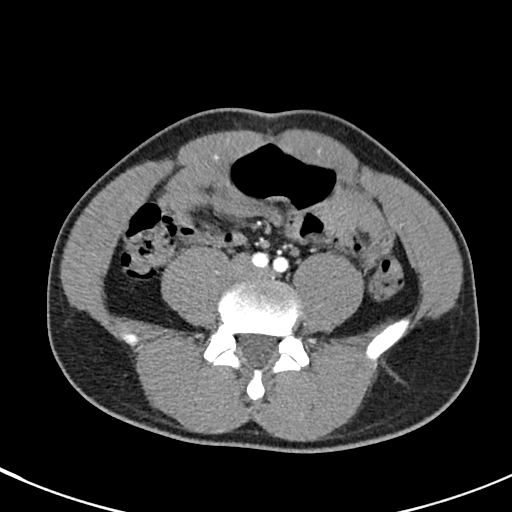
[im 18/71  soft-tissue]
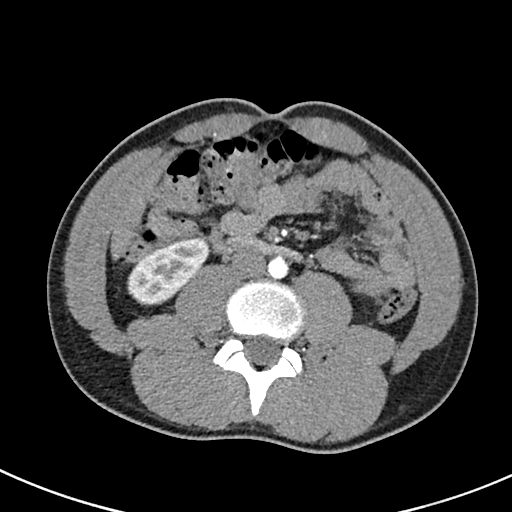
[im 24/71  soft-tissue]
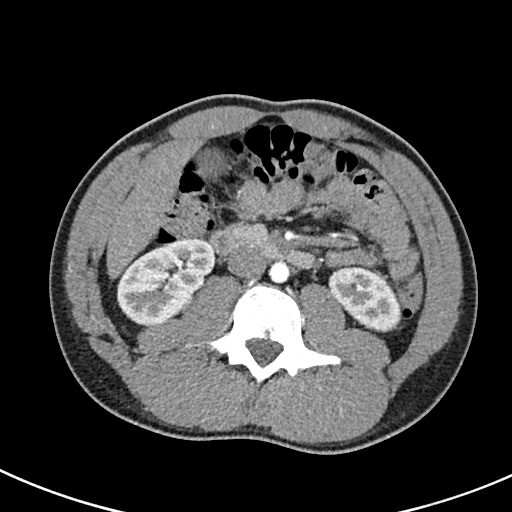
[im 30/71  soft-tissue]
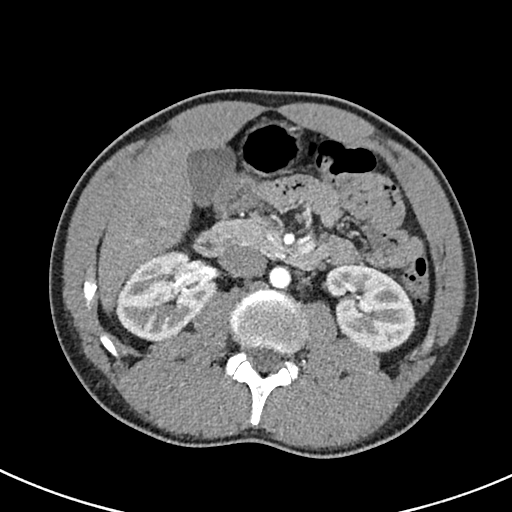

[Series 7: venous 5.0 br40 2 · axial · portal-venous · 0.58mm/px · z∈[-250,-100]mm · 6 of 43 slices shown, 11 images]
[im 7/43  soft-tissue]
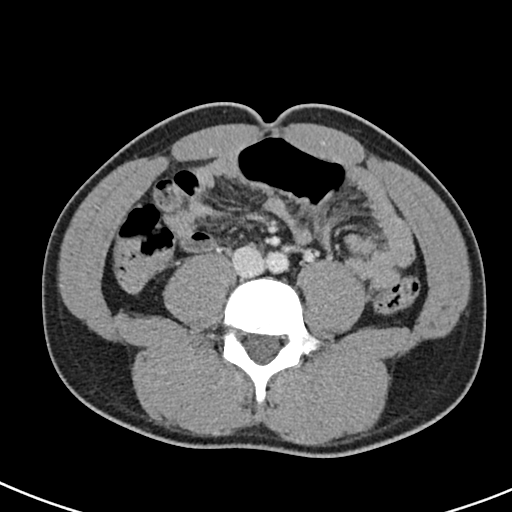
[im 7/43  bone]
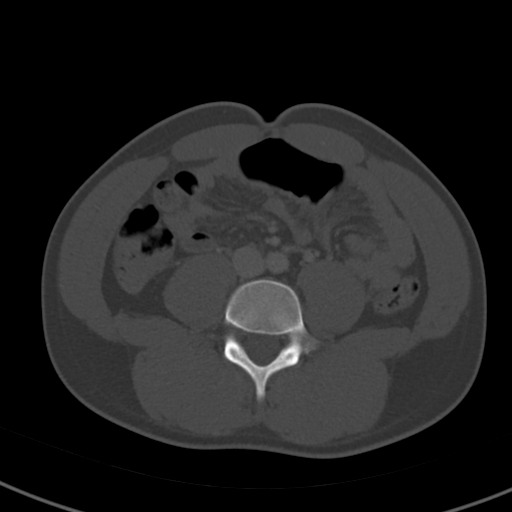
[im 13/43  soft-tissue]
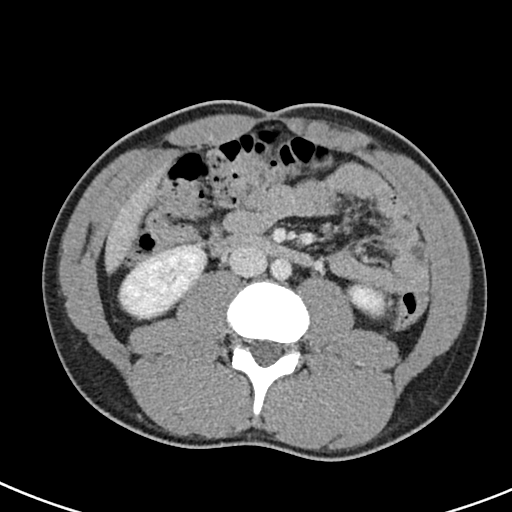
[im 19/43  soft-tissue]
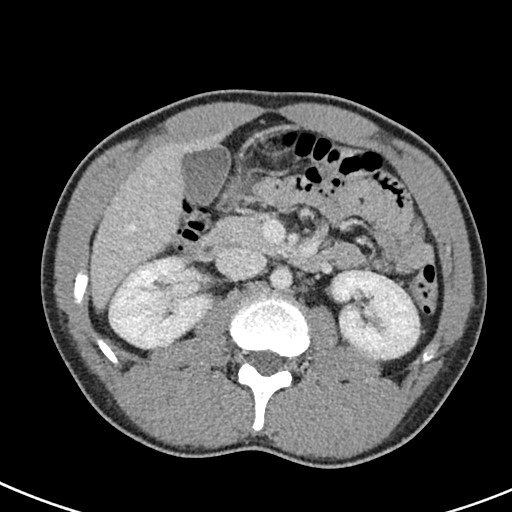
[im 19/43  lung]
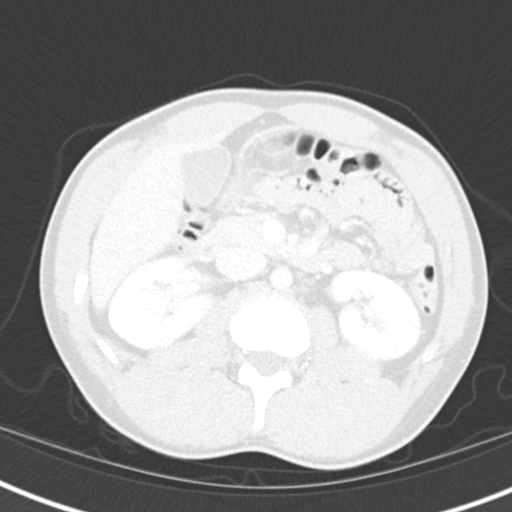
[im 25/43  soft-tissue]
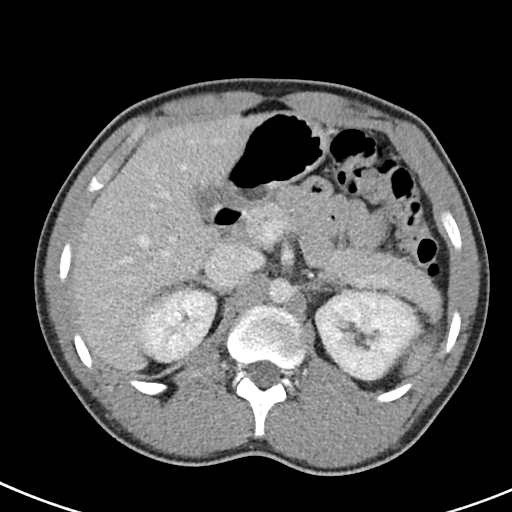
[im 25/43  lung]
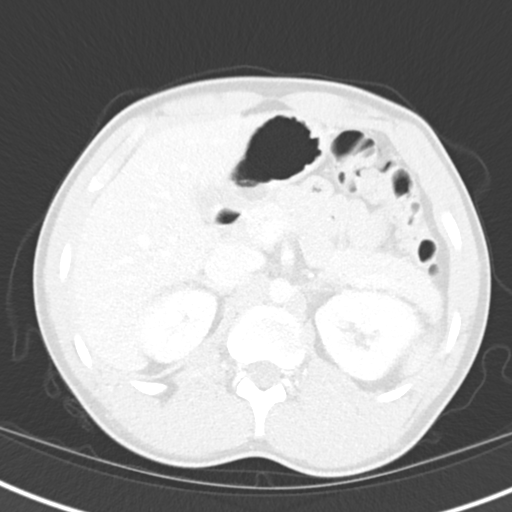
[im 31/43  soft-tissue]
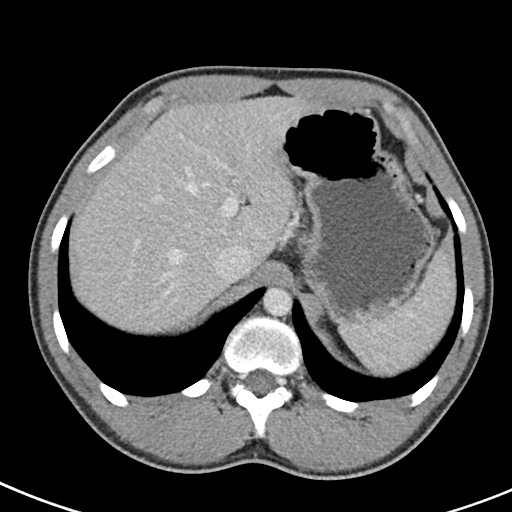
[im 31/43  lung]
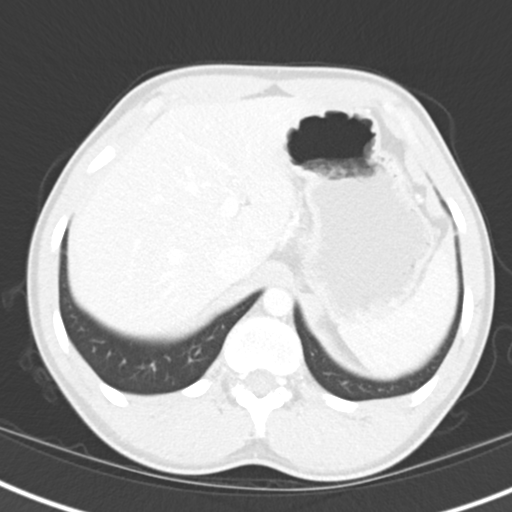
[im 37/43  soft-tissue]
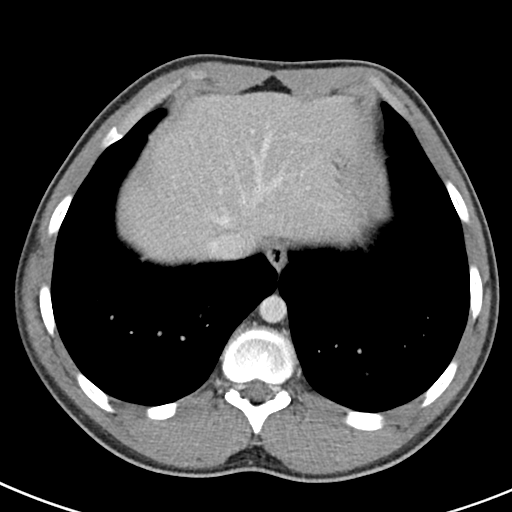
[im 37/43  lung]
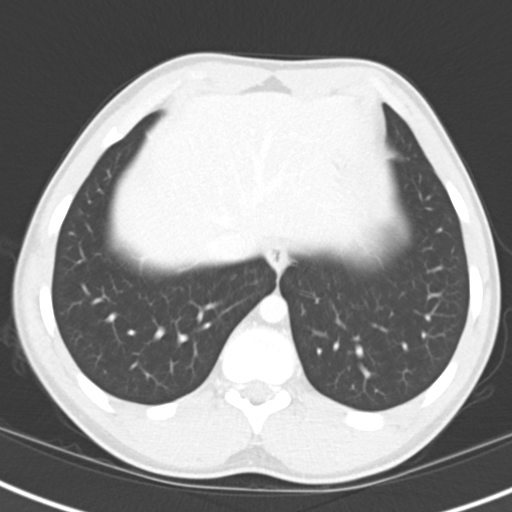

[Series 10: coronals · coronal · 0.43mm/px · 2 of 111 slices shown, 3 images]
[im 37/111  soft-tissue]
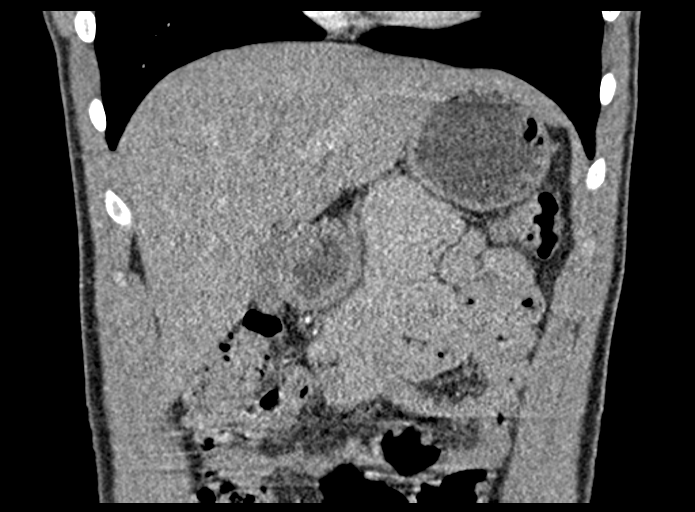
[im 37/111  bone]
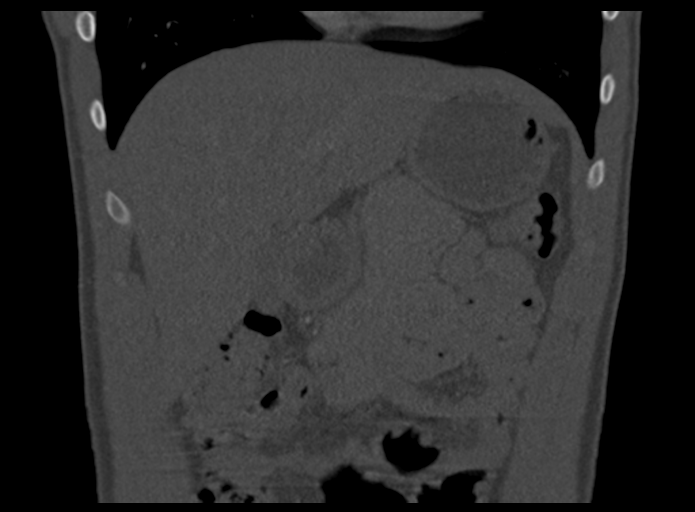
[im 74/111  soft-tissue]
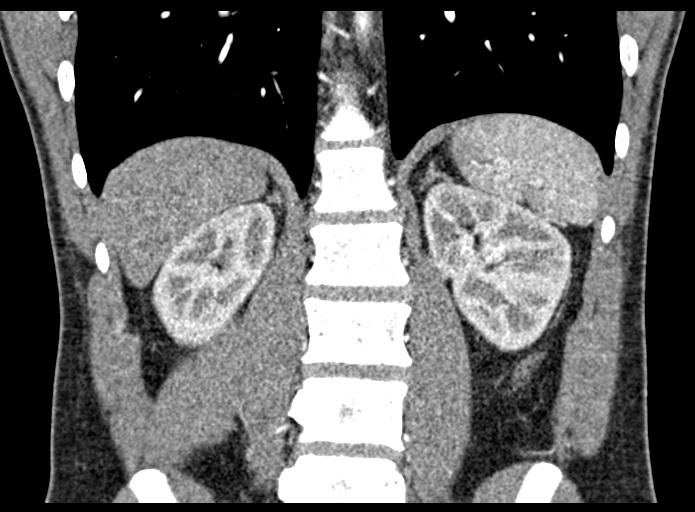

[12 of 46 positions shown; findings below may reference images not displayed]

RADIATION DOSE REDUCTION: This exam was performed according to the
departmental dose-optimization program which includes automated
exposure control, adjustment of the mA and/or kV according to
patient size and/or use of iterative reconstruction technique.

CONTRAST:  80mL OMNIPAQUE IOHEXOL 350 MG/ML SOLN
FINDINGS: Lower chest: No acute findings.

Hepatobiliary: No hepatic masses identified. Gallbladder is
unremarkable. No evidence of biliary ductal dilatation.

Pancreas:  No mass or inflammatory changes.

Spleen:  Within normal limits in size and appearance.

Adrenals/Urinary Tract: No evidence of adrenal mass. Both kidneys
are normal in appearance. No evidence of hydronephrosis.

Stomach/Bowel: Unremarkable.

Vascular/Lymphatic: No pathologically enlarged lymph nodes
identified. No acute vascular findings.

Other:  None.

Musculoskeletal:  No suspicious bone lesions identified.
IMPRESSION: Negative. No evidence of adrenal mass or other significant
abnormality.

## 2023-08-28 ENCOUNTER — Encounter: Payer: Self-pay | Admitting: Adult Health

## 2023-08-28 ENCOUNTER — Ambulatory Visit: Payer: Self-pay | Admitting: Adult Health

## 2023-08-28 ENCOUNTER — Other Ambulatory Visit (HOSPITAL_COMMUNITY)
Admission: RE | Admit: 2023-08-28 | Discharge: 2023-08-28 | Disposition: A | Discharge status: Home / Self Care | Source: Ambulatory Visit | Attending: Adult Health | Admitting: Adult Health

## 2023-08-28 ENCOUNTER — Ambulatory Visit (INDEPENDENT_AMBULATORY_CARE_PROVIDER_SITE_OTHER): Admitting: Adult Health

## 2023-08-28 VITALS — BP 130/80 | HR 65 | Temp 98.2°F | Ht 70.0 in | Wt 160.0 lb

## 2023-08-28 DIAGNOSIS — Z23 Encounter for immunization: Secondary | ICD-10-CM

## 2023-08-28 DIAGNOSIS — Z113 Encounter for screening for infections with a predominantly sexual mode of transmission: Secondary | ICD-10-CM | POA: Insufficient documentation

## 2023-08-28 DIAGNOSIS — Z0000 Encounter for general adult medical examination without abnormal findings: Secondary | ICD-10-CM | POA: Diagnosis not present

## 2023-08-28 DIAGNOSIS — I10 Essential (primary) hypertension: Secondary | ICD-10-CM

## 2023-08-28 DIAGNOSIS — I4711 Inappropriate sinus tachycardia, so stated: Secondary | ICD-10-CM | POA: Diagnosis not present

## 2023-08-28 LAB — CBC
HCT: 38.2 % — ABNORMAL LOW (ref 39.0–52.0)
Hemoglobin: 12.6 g/dL — ABNORMAL LOW (ref 13.0–17.0)
MCHC: 33 g/dL (ref 30.0–36.0)
MCV: 77.8 fl — ABNORMAL LOW (ref 78.0–100.0)
Platelets: 262 K/uL (ref 150.0–400.0)
RBC: 4.91 Mil/uL (ref 4.22–5.81)
RDW: 14.7 % (ref 11.5–15.5)
WBC: 2.9 K/uL — ABNORMAL LOW (ref 4.0–10.5)

## 2023-08-28 LAB — COMPREHENSIVE METABOLIC PANEL WITH GFR
ALT: 21 U/L (ref 0–53)
AST: 17 U/L (ref 0–37)
Albumin: 5 g/dL (ref 3.5–5.2)
Alkaline Phosphatase: 54 U/L (ref 39–117)
BUN: 17 mg/dL (ref 6–23)
CO2: 30 meq/L (ref 19–32)
Calcium: 9.9 mg/dL (ref 8.4–10.5)
Chloride: 101 meq/L (ref 96–112)
Creatinine, Ser: 1.21 mg/dL (ref 0.40–1.50)
GFR: 82.25 mL/min (ref >60.00)
Glucose, Bld: 100 mg/dL — ABNORMAL HIGH (ref 70–99)
Potassium: 3.9 meq/L (ref 3.5–5.1)
Sodium: 139 meq/L (ref 135–145)
Total Bilirubin: 0.4 mg/dL (ref 0.2–1.2)
Total Protein: 8.2 g/dL (ref 6.0–8.3)

## 2023-08-28 LAB — LIPID PANEL
Cholesterol: 172 mg/dL (ref 0–200)
HDL: 69.8 mg/dL (ref >39.00)
LDL Cholesterol: 88 mg/dL (ref 0–99)
NonHDL: 101.87
Total CHOL/HDL Ratio: 2
Triglycerides: 67 mg/dL (ref 0.0–149.0)
VLDL: 13.4 mg/dL (ref 0.0–40.0)

## 2023-08-28 LAB — TSH: TSH: 2.83 u[IU]/mL (ref 0.35–5.50)

## 2023-08-28 NOTE — Patient Instructions (Signed)
 It was great seeing you today   We will follow up with you regarding your lab work   Please let me know if you need anything

## 2023-08-28 NOTE — Progress Notes (Signed)
 Subjective:    Patient ID: DAYMOND CORDTS, male    DOB: 1995/12/22, 28 y.o.   MRN: [id number]  HPI Patient presents for yearly preventative medicine examination. He is a pleasant 28 year old male who  has a past medical history of Essential hypertension and Syncope and collapse.  Hypertension -currently prescribed Norvasc  10 mg, irbesartan  75 mg  and metoprolol  25 mg ER daily.  Has been well controlled on this medication in the past.  Denies dizziness, lightheadedness, chest pain, or shortness of breath. His last renal ultrasound in 05/2017 showed no stenosis.  He was referred to the advanced hypertension clinic in February 2023 for his blood pressure being elevated at 134/75 and heart rate was 107.  Aldosterone levels, catecholamines, metanephrines were ordered.    He was seen for follow-up on 06/22/2021 and prior to the appointment catecholamine level showed elevation and noncontrast CT of the abdomen was recommended but was not completed at this time.  CT was done in June 2023 which showed no evidence of adrenal mass or other significant abnormality.  Does monitor his blood pressures at home and are consistently running in the 110- 130s systolic with a heart rate in the 60s to 70s.  BP Readings from Last 3 Encounters:  08/28/23 130/80  11/24/22 112/60  06/15/22 130/62    Inappropriate sinus tachycardia  - Takes Metoprolol  25 mg ER daily. Reports infrequent if any palpitations    STD Screening - denies symptoms or concern but would like to have STD screening done    All immunizations and health maintenance protocols were reviewed with the patient and needed orders were placed.  Appropriate screening laboratory values were ordered for the patient including screening of hyperlipidemia, renal function and hepatic function.   Medication reconciliation,  past medical history, social history, problem list and allergies were reviewed in detail with the patient  Goals were established with regard  to weight loss, exercise, and  diet in compliance with medications He eats healthy and exercises on a routine basis.    Review of Systems  Constitutional: Negative.   HENT: Negative.    Eyes: Negative.   Respiratory: Negative.    Cardiovascular: Negative.   Gastrointestinal: Negative.   Endocrine: Negative.   Genitourinary: Negative.   Musculoskeletal: Negative.   Skin: Negative.   Allergic/Immunologic: Negative.   Neurological: Negative.   Hematological: Negative.   Psychiatric/Behavioral: Negative.    All other systems reviewed and are negative.  Past Medical History:  Diagnosis Date   Essential hypertension    Syncope and collapse     Social History   Socioeconomic History   Marital status: Single    Spouse name: Not on file   Number of children: Not on file   Years of education: Not on file   Highest education level: Not on file  Occupational History   Not on file  Tobacco Use   Smoking status: Never   Smokeless tobacco: Never  Vaping Use   Vaping status: Never Used  Substance and Sexual Activity   Alcohol use: No   Drug use: No   Sexual activity: Not Currently    Birth control/protection: Abstinence  Other Topics Concern   Not on file  Social History Narrative   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    Social Drivers of Corporate investment banker Strain: Not on file  Food Insecurity: Not on file  Transportation Needs: No Transportation Needs (04/06/2021)   PRAPARE - Administrator, Civil Service (Medical): No    Lack of Transportation (Non-Medical): No  Physical Activity: Sufficiently Active (04/06/2021)   Exercise Vital Sign    Days of Exercise per Week: 5 days    Minutes of Exercise per Session: 30 min  Stress: Not on file  Social Connections: Not on file  Intimate Partner Violence: Not on file    History reviewed. No pertinent surgical history.  Family History  Problem  Relation Age of Onset   Hypertension Mother    Syncope episode Mother    Hypertension Father    Heart attack Maternal Uncle    Heart attack Maternal Grandmother    Hypertension Maternal Grandfather    Heart attack Maternal Grandfather    Heart attack Paternal Grandmother    Heart attack Paternal Grandfather     No Known Allergies  Current Outpatient Medications on File Prior to Visit  Medication Sig Dispense Refill   amLODipine  (NORVASC ) 10 MG tablet TAKE 1 TABLET BY MOUTH EVERY DAY 90 tablet 3   irbesartan  (AVAPRO ) 75 MG tablet Take 1 tablet (75 mg total) by mouth daily. 90 tablet 3   metoprolol  succinate (TOPROL -XL) 25 MG 24 hr tablet Take 1 tablet (25 mg total) by mouth daily. 90 tablet 3   Multiple Vitamins-Minerals (MENS MULTI VITAMIN & MINERAL PO) Take 2 tablets by mouth daily.     No current facility-administered medications on file prior to visit.    BP 130/80   Pulse 65   Temp 98.2 F (36.8 C) (Oral)   Ht 5' 10 (1.778 m)   Wt 160 lb (72.6 kg)   SpO2 97%   BMI 22.96 kg/m       Objective:   Physical Exam Vitals and nursing note reviewed.  Constitutional:      General: He is not in acute distress.    Appearance: Normal appearance. He is not ill-appearing.  HENT:     Head: Normocephalic and atraumatic.     Right Ear: Tympanic membrane, ear canal and external ear normal. There is no impacted cerumen.     Left Ear: Tympanic membrane, ear canal and external ear normal. There is no impacted cerumen.     Nose: Nose normal. No congestion or rhinorrhea.     Mouth/Throat:     Mouth: Mucous membranes are moist.     Pharynx: Oropharynx is clear.  Eyes:     Extraocular Movements: Extraocular movements intact.     Conjunctiva/sclera: Conjunctivae normal.     Pupils: Pupils are equal, round, and reactive to light.  Neck:     Vascular: No carotid bruit.  Cardiovascular:     Rate and Rhythm: Normal rate and regular rhythm.     Pulses: Normal pulses.     Heart sounds:  No murmur heard.    No friction rub. No gallop.  Pulmonary:     Effort: Pulmonary effort is normal.     Breath sounds: Normal breath sounds.  Abdominal:     General: Abdomen is flat. Bowel sounds are normal. There is no distension.     Palpations: Abdomen is soft. There is no mass.     Tenderness: There is no abdominal tenderness. There is no guarding or rebound.     Hernia: No hernia is present.  Musculoskeletal:        General: Normal range of motion.     Cervical back: Normal range of motion and  neck supple.  Lymphadenopathy:     Cervical: No cervical adenopathy.  Skin:    General: Skin is warm and dry.     Capillary Refill: Capillary refill takes less than 2 seconds.  Neurological:     General: No focal deficit present.     Mental Status: He is alert and oriented to person, place, and time.  Psychiatric:        Mood and Affect: Mood normal.        Behavior: Behavior normal.        Thought Content: Thought content normal.        Judgment: Judgment normal.        Assessment & Plan:  1. Routine general medical examination at a health care facility (Primary) Today patient counseled on age appropriate routine health concerns for screening and prevention, each reviewed and up to date or declined. Immunizations reviewed and up to date or declined. Labs ordered and reviewed. Risk factors for depression reviewed and negative. Hearing function and visual acuity are intact. ADLs screened and addressed as needed. Functional ability and level of safety reviewed and appropriate. Education, counseling and referrals performed based on assessed risks today. Patient provided with a copy of personalized plan for preventive services. - Continue to eat healthy and exercise - Follow up in one year or sooner if needed  2. Hypertension, unspecified type - Well controlled. No change in medication  - Lipid panel; Future - TSH; Future - CBC; Future - Comprehensive metabolic panel with GFR;  Future  3. Inappropriate sinus tachycardia (HCC) - Sinus Rhythm today  - Lipid panel; Future - TSH; Future - CBC; Future - Comprehensive metabolic panel with GFR; Future  4. Need for HPV vaccination  - HPV 9-valent vaccine,Recombinat  5. Screening for STD (sexually transmitted disease)  - HIV Antibody (routine testing w rflx); Future - RPR; Future - Urine cytology ancillary only  Darleene Shape, NP

## 2023-08-28 NOTE — Addendum Note (Signed)
 Addended by: VICCI LEADER R on: 08/28/2023 10:40 AM   Modules accepted: Orders

## 2023-08-29 LAB — URINE CYTOLOGY ANCILLARY ONLY
Chlamydia: NEGATIVE
Comment: NEGATIVE
Comment: NEGATIVE
Comment: NORMAL
Neisseria Gonorrhea: NEGATIVE
Trichomonas: NEGATIVE

## 2023-08-29 LAB — HIV ANTIBODY (ROUTINE TESTING W REFLEX): HIV 1&2 Ab, 4th Generation: NONREACTIVE

## 2023-08-29 LAB — RPR: RPR Ser Ql: NONREACTIVE

## 2023-10-31 ENCOUNTER — Other Ambulatory Visit: Payer: Self-pay | Admitting: Adult Health

## 2023-10-31 DIAGNOSIS — I10 Essential (primary) hypertension: Secondary | ICD-10-CM

## 2023-11-27 ENCOUNTER — Other Ambulatory Visit: Payer: Self-pay | Admitting: Adult Health

## 2023-11-27 ENCOUNTER — Telehealth: Payer: Self-pay | Admitting: Internal Medicine

## 2023-11-27 DIAGNOSIS — I4711 Inappropriate sinus tachycardia, so stated: Secondary | ICD-10-CM

## 2023-11-27 NOTE — Telephone Encounter (Unsigned)
 Copied from CRM #[id number]. Topic: Clinical - Medication Refill >> Nov 27, 2023  4:46 PM [place] J wrote: Medication: metoprolol  succinate (TOPROL -XL) 25 MG 24 hr tablet  Has the patient contacted their pharmacy? Yes (Agent: If no, request that the patient contact the pharmacy for the refill. If patient does not wish to contact the pharmacy document the reason why and proceed with request.) (Agent: If yes, when and what did the pharmacy advise?)  This is the patient's preferred pharmacy:  [facility] - [place], [place] - [address] [place] [address] Phone: [phone] Fax: [phone]  Is this the correct pharmacy for this prescription? Yes If no, delete pharmacy and type the correct one.   Has the prescription been filled recently? No  Is the patient out of the medication? Yes  Has the patient been seen for an appointment in the last year OR does the patient have an upcoming appointment? Yes  Can we respond through MyChart? Yes  Agent: Please be advised that Rx refills may take up to 3 business days. We ask that you follow-up with your pharmacy.

## 2023-11-27 NOTE — Telephone Encounter (Signed)
*  STAT* If patient is at the pharmacy, call can be transferred to refill team.   1. Which medications need to be refilled? (please list name of each medication and dose if known) metoprolol  succinate (TOPROL -XL) 25 MG 24 hr tablet   2. Which pharmacy/location (including street and city if local pharmacy) is medication to be sent to? [facility] - [place], [place] - [address]   3. Do they need a 30 day or 90 day supply? 90  Patient has appt on 10/16

## 2023-11-28 ENCOUNTER — Other Ambulatory Visit: Payer: Self-pay

## 2023-11-28 ENCOUNTER — Other Ambulatory Visit (HOSPITAL_COMMUNITY): Payer: Self-pay

## 2023-11-28 MED ORDER — METOPROLOL SUCCINATE ER 25 MG PO TB24
25.0000 mg | ORAL_TABLET | Freq: Every day | ORAL | 0 refills | Status: DC
Start: 2023-11-28 — End: 2023-11-28
  Filled 2023-11-28: qty 90, 90d supply, fill #0

## 2023-11-28 MED ORDER — METOPROLOL SUCCINATE ER 25 MG PO TB24
25.0000 mg | ORAL_TABLET | Freq: Every day | ORAL | 0 refills | Status: AC
Start: 2023-11-28 — End: ?

## 2023-11-28 NOTE — Addendum Note (Signed)
 Addended by: DARIO IZETTA CROME on: 11/28/2023 09:41 AM   Modules accepted: Orders

## 2023-12-05 NOTE — Progress Notes (Unsigned)
 [facility] Follow-up:    Date:  12/06/2023   ID:  Hayden Webb, DOB [birth year], MRN [id number]  PCP:  Merna Huxley, NP  Cardiologist:  Vinie JAYSON Maxcy, MD   Referring MD: Merna Huxley, NP   CC: Hypertension  History of Present Illness:    Hayden Webb is a 28 y.o. male with a hx of hypertension and episodes of syncope here for follow-up. He was initially seen in the [facility]. He was seen by Dr Fernande on 10/22 for syncope. It was thought to be neurally mediated . At that visit, blood pressure was 134/74. He is on amlodipine  and metoprolol . He was tachycardic at 107. His heart rates has been consistently elevated. Metoprolol  was increased for tachycardia. Echo 12/2020 revealed LVEF 60-65 with normal diastolic function. Urine metanephrines were normal. Thyroid  function was normal  He was first diagnosed with hypertension in his early 20's. His mother also had early onset hypertension. Renal artery dopplers 03/2021 were normal. Renin and aldosterone were normal. Epinephrine levels were mildly elevated. He had a CT of the abdomen that had no adrenal adenomas. Irbesartan  was added. He followed up with Josefa Beauvais, NP and his BP was 132/64. His heart rate in the office was 118 bpm but at home it had been in the 50's-70's so no changes were made.   At his visit 09/2021 he was doing well and BP was controlled.  Discussed the use of AI scribe software for clinical note transcription with the patient, who gave verbal consent to proceed.  History of Present Illness Mr. [name] has been monitoring his blood pressure at home, with readings consistently under 130/80 mmHg, ranging from 100s to 110s over 60s to 70s since July. No chest pain, breathing difficulties, swelling in his legs or feet, lightheadedness, or dizziness.  He engages in regular physical activity, including stretching, push-ups, sit-ups, and arches. He is considering starting running.  He  recently obtained a refill for his hypertension medication and reports no issues with his current regimen.  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 He is currently seeking another position in the field.   Past Medical History:  Diagnosis Date   Essential hypertension    Syncope and collapse     Past Surgical History:  Procedure Laterality Date   NO PAST SURGERIES      Current Medications: Current Meds  Medication Sig   amLODipine  (NORVASC ) 10 MG tablet TAKE 1 TABLET BY MOUTH EVERY DAY   irbesartan  (AVAPRO ) 75 MG tablet TAKE 1 TABLET BY MOUTH DAILY *CALL TO SCHEDULE YEARLY APPOINTMENT FOR MORE REFILLS*   metoprolol  succinate (TOPROL -XL) 25 MG 24 hr tablet Take 1 tablet (25 mg total) by mouth daily.   Multiple Vitamins-Minerals (MENS MULTI VITAMIN & MINERAL PO) Take 2 tablets by mouth daily.     Allergies:   Patient has no known allergies.   Social History   Socioeconomic History   Marital status: Single    Spouse name: Not on file   Number of children: Not on file   Years of education: Not on file   Highest education level: Not on file  Occupational History   Not on file  Tobacco Use   Smoking status: Never    Passive exposure: Never   Smokeless tobacco: Never  Vaping Use   Vaping status: Never Used  Substance and Sexual Activity   Alcohol use: No   Drug  use: No   Sexual activity: Not Currently    Birth control/protection: Abstinence  Other Topics Concern   Not on file  Social History Narrative      He likes to spend time with family.    Social Drivers of Corporate investment banker Strain: Not on file  Food Insecurity: Not on file  Transportation Needs: No Transportation Needs (04/06/2021)   PRAPARE - Administrator, Civil Service (Medical): No    Lack of Transportation (Non-Medical): No  Physical Activity: Sufficiently Active (04/06/2021)   Exercise Vital Sign    Days of  Exercise per Week: 5 days    Minutes of Exercise per Session: 30 min  Stress: Not on file  Social Connections: Not on file     Family History: The patient's family history includes Heart attack in his maternal grandfather, maternal grandmother, maternal uncle, paternal grandfather, and paternal grandmother; Hypertension in his father, maternal grandfather, and mother; Syncope episode in his mother.  ROS:   Please see the history of present illness.    All other systems reviewed and are negative.  EKGs/Labs/Other Studies Reviewed:    Bilateral Renal Artery Dopplers  04/13/2021: Summary:  Largest Aortic Diameter: 1.3 cm     Renal:     Right: Normal size right kidney. Normal right Resisitive Index.         Normal cortical thickness of right kidney. No evidence of         right renal artery stenosis. RRV flow present.  Left:  Normal size of left kidney. Normal left Resistive Index.         Normal cortical thickness of the left kidney. No evidence of         left renal artery stenosis. LRV flow present.  Mesenteric:     Limited evaluation of aorta due to overlying bowel. Aortic branches not  seen.   ECHO 12/31/2020:  1. Left ventricular ejection fraction, by estimation, is 60 to 65%. The left ventricle has normal function. The left ventricle has no regional wall motion abnormalities. Left ventricular diastolic parameters were normal.   2. Right ventricular systolic function is normal. The right ventricular size is normal. Tricuspid regurgitation signal is inadequate for assessing PA pressure.   3. The mitral valve is normal in structure. No evidence of mitral valve regurgitation.   4. The aortic valve was not well visualized. Aortic valve regurgitation is not visualized. No aortic stenosis is present.   5. The inferior vena cava is normal in size with greater than 50%  respiratory variability, suggesting right atrial pressure of 3 mmHg.   EKG:  EKG is personally reviewed.    10/12/2021: Sinus tachycardia. Rate 105 bpm. 04/06/2021: sinus rhythm rate- 90 bpm  Recent Labs: 08/28/2023: ALT 21; BUN 17; Creatinine, Ser 1.21; Hemoglobin 12.6; Platelets 262.0; Potassium 3.9; Sodium 139; TSH 2.83   Recent Lipid Panel    Component Value Date/Time   CHOL 172 08/28/2023 1025   TRIG 67.0 08/28/2023 1025   HDL 69.80 08/28/2023 1025   CHOLHDL 2 08/28/2023 1025   VLDL 13.4 08/28/2023 1025   LDLCALC 88 08/28/2023 1025    Physical Exam:    VS:  BP 138/82 (BP Location: Right Arm, Patient Position: Sitting, Cuff Size: Normal)   Pulse (!) 108   Ht 5' 10 (1.778 m)   Wt 159 lb 9.6 oz (72.4 kg)   SpO2 98%   BMI 22.90 kg/m  , BMI Body mass index is 22.9 kg/m.  GENERAL:  Well appearing HEENT: Pupils equal round and reactive, fundi not visualized, oral mucosa unremarkable NECK:  No jugular venous distention, waveform within normal limits, carotid upstroke brisk and symmetric, no bruits, no thyromegaly LUNGS:  Clear to auscultation bilaterally HEART:  RRR.  PMI not displaced or sustained,S1 and S2 within normal limits, no S3, no S4, no clicks, no rubs, 2/6 systolic murmur ABD:  Flat, positive bowel sounds normal in frequency in pitch, no bruits, no rebound, no guarding, no midline pulsatile mass, no hepatomegaly, no splenomegaly EXT:  2 plus pulses throughout, no edema, no cyanosis no clubbing SKIN:  No rashes no nodules NEURO:  Cranial nerves II through XII grossly intact, motor grossly intact throughout PSYCH:  Cognitively intact, oriented to person place and time   ASSESSMENT/PLAN:    Assessment & Plan # Primary hypertension Hypertension well-controlled with home readings under 130/80 mmHg. Heart rates stable. No symptoms. Cholesterol levels normal. Current management effective.   He developed HTN at a young age, but no secondary cause was identified.  - Continue current antihypertensive regimen.  Amlodipine , irbesartan , metoprolol .  - Monitor blood pressure at  home.. - Return to clinic if blood pressure control worsens. 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  Rates at home seem to be well-controlled on metoprolol .  Therefore we will not titrate the dose.   Screening for Secondary Hypertension:     04/06/2021   12:12 PM  Causes  Drugs/Herbals Screened     - Comments limits sodium, no EtOH, no NSAIDs  Renovascular HTN Screened     - Comments check renal artery Dopplers  Sleep Apnea Screened  Thyroid  Disease Screened  Hyperaldosteronism Screened     - Comments check renin/aldo  Pheochromocytoma Screened     - Comments check catecholamines and metanephrines  Cushing's Syndrome N/A  Hyperparathyroidism Screened  Coarctation of the Aorta Screened     - Comments BP symmetric  Compliance Screened    Relevant Labs/Studies:    Latest Ref Rng & Units 08/28/2023   10:25 AM 06/15/2022    8:35 AM 07/11/2021   10:40 AM  Basic Labs  Sodium 135 - 145 mEq/L 139  139  137   Potassium 3.5 - 5.1 mEq/L 3.9  3.8  4.2   Creatinine 0.40 - 1.50 mg/dL 8.78  8.82  8.86        Latest Ref Rng & Units 08/28/2023   10:25 AM 06/15/2022    8:35 AM  Thyroid    TSH 0.35 - 5.50 uIU/mL 2.83  3.49        Disposition:    FU with Devin Ganaway C. Raford, MD, [facility] as needed.   Medication Adjustments/Labs and Tests Ordered: Current medicines are reviewed at length with the patient today.  Concerns regarding medicines are outlined above.   Orders Placed This Encounter  Procedures   EKG 12-Lead   No orders of the defined types were placed in this encounter.  Patient Instructions  Medication Instructions:  Your physician recommends that you continue on your current medications as directed. Please refer to the Current Medication list given to you today.   Labwork: NONE  Testing/Procedures: NONE  Follow-Up: AS NEEDED   Any Other Special Instructions Will Be Listed Below (If Applicable). AT YOUR NEXT VISIT  WITH YOUR PRIMARY CARE DISCUSS WITH THEM REFILLING YOUR MEDICATIONS DR [facility] PRESCRIBED NEXT YEAR     Signed, Annabella Raford, MD  12/06/2023 8:21 AM    [facility]

## 2023-12-06 ENCOUNTER — Ambulatory Visit: Admitting: Student

## 2023-12-06 ENCOUNTER — Encounter (HOSPITAL_BASED_OUTPATIENT_CLINIC_OR_DEPARTMENT_OTHER): Payer: Self-pay | Admitting: Cardiovascular Disease

## 2023-12-06 ENCOUNTER — Ambulatory Visit (INDEPENDENT_AMBULATORY_CARE_PROVIDER_SITE_OTHER): Admitting: Cardiovascular Disease

## 2023-12-06 VITALS — BP 138/82 | HR 108 | Ht 70.0 in | Wt 159.6 lb

## 2023-12-06 DIAGNOSIS — I10 Essential (primary) hypertension: Secondary | ICD-10-CM | POA: Diagnosis not present

## 2023-12-06 DIAGNOSIS — I4711 Inappropriate sinus tachycardia, so stated: Secondary | ICD-10-CM | POA: Diagnosis not present

## 2023-12-06 NOTE — Patient Instructions (Addendum)
 Medication Instructions:  Your physician recommends that you continue on your current medications as directed. Please refer to the Current Medication list given to you today.   Labwork: NONE  Testing/Procedures: NONE  Follow-Up: AS NEEDED   Any Other Special Instructions Will Be Listed Below (If Applicable). AT YOUR NEXT VISIT WITH YOUR PRIMARY CARE DISCUSS WITH THEM REFILLING YOUR MEDICATIONS DR [facility] PRESCRIBED NEXT YEAR

## 2024-02-07 ENCOUNTER — Other Ambulatory Visit: Payer: Self-pay | Admitting: Cardiovascular Disease

## 2024-02-07 DIAGNOSIS — I4711 Inappropriate sinus tachycardia, so stated: Secondary | ICD-10-CM

## 2024-03-20 ENCOUNTER — Other Ambulatory Visit: Payer: Self-pay | Admitting: Adult Health

## 2024-03-20 DIAGNOSIS — I10 Essential (primary) hypertension: Secondary | ICD-10-CM
